# Patient Record
Sex: Male | Born: 1970 | Hispanic: Yes | Marital: Married | Smoking: Never smoker
Health system: Southern US, Community
[De-identification: ages and names within clinical notes are randomized; demographics above are authoritative.]

## PROBLEM LIST (undated history)

## (undated) DIAGNOSIS — E785 Hyperlipidemia, unspecified: Secondary | ICD-10-CM

## (undated) DIAGNOSIS — I10 Essential (primary) hypertension: Secondary | ICD-10-CM

## (undated) DIAGNOSIS — A6920 Lyme disease, unspecified: Secondary | ICD-10-CM

---

## 2018-08-26 ENCOUNTER — Encounter: Payer: Self-pay | Admitting: Emergency Medicine

## 2018-08-26 ENCOUNTER — Inpatient Hospital Stay
Admission: EM | Admit: 2018-08-26 | Discharge: 2018-08-27 | DRG: 918 | Disposition: A | Discharge status: Home / Self Care | Payer: BLUE CROSS/BLUE SHIELD | Attending: Internal Medicine | Admitting: Internal Medicine

## 2018-08-26 ENCOUNTER — Emergency Department: Payer: BLUE CROSS/BLUE SHIELD

## 2018-08-26 ENCOUNTER — Inpatient Hospital Stay: Payer: BLUE CROSS/BLUE SHIELD

## 2018-08-26 ENCOUNTER — Other Ambulatory Visit: Payer: Self-pay

## 2018-08-26 DIAGNOSIS — T405X1A Poisoning by cocaine, accidental (unintentional), initial encounter: Principal | ICD-10-CM | POA: Diagnosis present

## 2018-08-26 DIAGNOSIS — E785 Hyperlipidemia, unspecified: Secondary | ICD-10-CM | POA: Diagnosis present

## 2018-08-26 DIAGNOSIS — R51 Headache: Secondary | ICD-10-CM | POA: Diagnosis present

## 2018-08-26 DIAGNOSIS — F1010 Alcohol abuse, uncomplicated: Secondary | ICD-10-CM | POA: Diagnosis present

## 2018-08-26 DIAGNOSIS — Z1159 Encounter for screening for other viral diseases: Secondary | ICD-10-CM

## 2018-08-26 DIAGNOSIS — F1490 Cocaine use, unspecified, uncomplicated: Secondary | ICD-10-CM | POA: Diagnosis present

## 2018-08-26 DIAGNOSIS — Z79899 Other long term (current) drug therapy: Secondary | ICD-10-CM | POA: Diagnosis not present

## 2018-08-26 DIAGNOSIS — Z8249 Family history of ischemic heart disease and other diseases of the circulatory system: Secondary | ICD-10-CM

## 2018-08-26 DIAGNOSIS — E860 Dehydration: Secondary | ICD-10-CM | POA: Diagnosis present

## 2018-08-26 DIAGNOSIS — R7989 Other specified abnormal findings of blood chemistry: Secondary | ICD-10-CM

## 2018-08-26 DIAGNOSIS — I10 Essential (primary) hypertension: Secondary | ICD-10-CM | POA: Diagnosis present

## 2018-08-26 DIAGNOSIS — E872 Acidosis: Secondary | ICD-10-CM | POA: Diagnosis present

## 2018-08-26 DIAGNOSIS — R519 Headache, unspecified: Secondary | ICD-10-CM

## 2018-08-26 HISTORY — DX: Essential (primary) hypertension: I10

## 2018-08-26 HISTORY — DX: Lyme disease, unspecified: A69.20

## 2018-08-26 HISTORY — DX: Hyperlipidemia, unspecified: E78.5

## 2018-08-26 LAB — CBC WITH DIFFERENTIAL/PLATELET
Abs Immature Granulocytes: 0.02 10*3/uL (ref 0.00–0.07)
Basophils Absolute: 0 10*3/uL (ref 0.0–0.1)
Basophils Relative: 1 %
Eosinophils Absolute: 0 10*3/uL (ref 0.0–0.5)
Eosinophils Relative: 0 %
HCT: 44.4 % (ref 39.0–52.0)
Hemoglobin: 15.6 g/dL (ref 13.0–17.0)
Immature Granulocytes: 0 %
Lymphocytes Relative: 29 %
Lymphs Abs: 1.4 10*3/uL (ref 0.7–4.0)
MCH: 30.5 pg (ref 26.0–34.0)
MCHC: 35.1 g/dL (ref 30.0–36.0)
MCV: 86.9 fL (ref 80.0–100.0)
Monocytes Absolute: 0.3 10*3/uL (ref 0.1–1.0)
Monocytes Relative: 7 %
Neutro Abs: 3 10*3/uL (ref 1.7–7.7)
Neutrophils Relative %: 63 %
Platelets: 179 10*3/uL (ref 150–400)
RBC: 5.11 MIL/uL (ref 4.22–5.81)
RDW: 12.9 % (ref 11.5–15.5)
WBC: 4.9 10*3/uL (ref 4.0–10.5)
nRBC: 0 % (ref 0.0–0.2)

## 2018-08-26 LAB — BLOOD GAS, ARTERIAL
Acid-Base Excess: 2 mmol/L (ref 0.0–2.0)
Allens test (pass/fail): POSITIVE — AB
Bicarbonate: 27.8 mmol/L (ref 20.0–28.0)
FIO2: 0.21
O2 Saturation: 96.7 %
Patient temperature: 37
pCO2 arterial: 47 mmHg (ref 32.0–48.0)
pH, Arterial: 7.38 (ref 7.350–7.450)
pO2, Arterial: 89 mmHg (ref 83.0–108.0)

## 2018-08-26 LAB — COMPREHENSIVE METABOLIC PANEL
ALT: 29 U/L (ref 0–44)
AST: 32 U/L (ref 15–41)
Albumin: 4.5 g/dL (ref 3.5–5.0)
Alkaline Phosphatase: 55 U/L (ref 38–126)
Anion gap: 16 — ABNORMAL HIGH (ref 5–15)
BUN: 6 mg/dL (ref 6–20)
CO2: 24 mmol/L (ref 22–32)
Calcium: 8.5 mg/dL — ABNORMAL LOW (ref 8.9–10.3)
Chloride: 97 mmol/L — ABNORMAL LOW (ref 98–111)
Creatinine, Ser: 0.5 mg/dL — ABNORMAL LOW (ref 0.61–1.24)
GFR calc Af Amer: >60 mL/min (ref >60)
GFR calc non Af Amer: >60 mL/min (ref >60)
Glucose, Bld: 199 mg/dL — ABNORMAL HIGH (ref 70–99)
Potassium: 3.5 mmol/L (ref 3.5–5.1)
Sodium: 137 mmol/L (ref 135–145)
Total Bilirubin: 0.7 mg/dL (ref 0.3–1.2)
Total Protein: 7.9 g/dL (ref 6.5–8.1)

## 2018-08-26 LAB — LACTIC ACID, PLASMA
Lactic Acid, Venous: 3 mmol/L (ref 0.5–1.9)
Lactic Acid, Venous: 3.3 mmol/L (ref 0.5–1.9)
Lactic Acid, Venous: 3.4 mmol/L (ref 0.5–1.9)
Lactic Acid, Venous: 2.9 mmol/L (ref 0.5–1.9)

## 2018-08-26 LAB — URINALYSIS, ROUTINE W REFLEX MICROSCOPIC
Bacteria, UA: NONE SEEN
Bilirubin Urine: NEGATIVE
Glucose, UA: 50 mg/dL — AB
Ketones, ur: 5 mg/dL — AB
Leukocytes,Ua: NEGATIVE
Nitrite: NEGATIVE
Protein, ur: 30 mg/dL — AB
Specific Gravity, Urine: 1.012 (ref 1.005–1.030)
Squamous Epithelial / HPF: NONE SEEN (ref 0–5)
pH: 5 (ref 5.0–8.0)

## 2018-08-26 LAB — GLUCOSE, CAPILLARY: Glucose-Capillary: 186 mg/dL — ABNORMAL HIGH (ref 70–99)

## 2018-08-26 LAB — URINE DRUG SCREEN, QUALITATIVE (ARMC ONLY)
Amphetamines, Ur Screen: NOT DETECTED
Barbiturates, Ur Screen: NOT DETECTED
Benzodiazepine, Ur Scrn: NOT DETECTED
Cannabinoid 50 Ng, Ur ~~LOC~~: NOT DETECTED
Cocaine Metabolite,Ur ~~LOC~~: POSITIVE — AB
MDMA (Ecstasy)Ur Screen: NOT DETECTED
Methadone Scn, Ur: NOT DETECTED
Opiate, Ur Screen: NOT DETECTED
Phencyclidine (PCP) Ur S: NOT DETECTED
Tricyclic, Ur Screen: NOT DETECTED

## 2018-08-26 LAB — SARS CORONAVIRUS 2 BY RT PCR (HOSPITAL ORDER, PERFORMED IN ~~LOC~~ HOSPITAL LAB): SARS Coronavirus 2: NEGATIVE

## 2018-08-26 LAB — ETHANOL: Alcohol, Ethyl (B): 241 mg/dL — ABNORMAL HIGH (ref <10)

## 2018-08-26 LAB — BETA-HYDROXYBUTYRIC ACID: Beta-Hydroxybutyric Acid: 0.22 mmol/L (ref 0.05–0.27)

## 2018-08-26 LAB — MAGNESIUM: Magnesium: 2.1 mg/dL (ref 1.7–2.4)

## 2018-08-26 LAB — SALICYLATE LEVEL: Salicylate Lvl: <7 mg/dL (ref 2.8–30.0)

## 2018-08-26 LAB — ACETAMINOPHEN LEVEL: Acetaminophen (Tylenol), Serum: 12 ug/mL (ref 10–30)

## 2018-08-26 MED ORDER — LORAZEPAM 2 MG/ML IJ SOLN: 0.0000 mg | Freq: Two times a day (BID) | INTRAMUSCULAR | Status: DC

## 2018-08-26 MED ORDER — HYDRALAZINE HCL 20 MG/ML IJ SOLN
10.0000 mg | Freq: Four times a day (QID) | INTRAMUSCULAR | Status: DC | PRN
  Filled 2018-08-26: qty 0.5

## 2018-08-26 MED ORDER — SODIUM CHLORIDE 0.9 % IV BOLUS
1000.0000 mL | Freq: Once | INTRAVENOUS | Status: AC
  Administered 2018-08-26: 1000 mL via INTRAVENOUS

## 2018-08-26 MED ORDER — POLYETHYLENE GLYCOL 3350 17 G PO PACK: 17.0000 g | PACK | Freq: Every day | ORAL | Status: DC | PRN

## 2018-08-26 MED ORDER — LORAZEPAM 2 MG/ML IJ SOLN
1.0000 mg | Freq: Once | INTRAMUSCULAR | Status: AC
  Administered 2018-08-26: 1 mg via INTRAVENOUS
  Filled 2018-08-26: qty 1

## 2018-08-26 MED ORDER — LISINOPRIL 5 MG PO TABS
5.0000 mg | ORAL_TABLET | Freq: Every day | ORAL | Status: DC
  Administered 2018-08-26 – 2018-08-27 (×2): 5 mg via ORAL
  Filled 2018-08-26 (×2): qty 1

## 2018-08-26 MED ORDER — ACETAMINOPHEN 650 MG RE SUPP
650.0000 mg | Freq: Four times a day (QID) | RECTAL | Status: DC | PRN
  Filled 2018-08-26: qty 1

## 2018-08-26 MED ORDER — SUMATRIPTAN SUCCINATE 50 MG PO TABS
25.0000 mg | ORAL_TABLET | ORAL | Status: DC | PRN
  Filled 2018-08-26: qty 1

## 2018-08-26 MED ORDER — HYDROMORPHONE HCL 1 MG/ML IJ SOLN
1.0000 mg | INTRAMUSCULAR | Status: AC
  Administered 2018-08-26: 1 mg via INTRAVENOUS
  Filled 2018-08-26: qty 1

## 2018-08-26 MED ORDER — THIAMINE HCL 100 MG/ML IJ SOLN
Freq: Once | INTRAVENOUS | Status: DC
  Filled 2018-08-26: qty 1000

## 2018-08-26 MED ORDER — FOLIC ACID 1 MG PO TABS
1.0000 mg | ORAL_TABLET | Freq: Once | ORAL | Status: AC
  Administered 2018-08-26: 09:00:00 1 mg via ORAL
  Filled 2018-08-26: qty 1

## 2018-08-26 MED ORDER — THIAMINE HCL 100 MG/ML IJ SOLN
100.0000 mg | Freq: Once | INTRAMUSCULAR | Status: AC
  Administered 2018-08-26: 100 mg via INTRAVENOUS
  Filled 2018-08-26: qty 2

## 2018-08-26 MED ORDER — FOLIC ACID 1 MG PO TABS
1.0000 mg | ORAL_TABLET | Freq: Every day | ORAL | Status: DC
  Administered 2018-08-27: 1 mg via ORAL
  Filled 2018-08-26: qty 1

## 2018-08-26 MED ORDER — LORAZEPAM 2 MG/ML IJ SOLN
0.0000 mg | Freq: Four times a day (QID) | INTRAMUSCULAR | Status: DC
  Administered 2018-08-26 – 2018-08-27 (×2): 1 mg via INTRAVENOUS
  Filled 2018-08-26 (×3): qty 1

## 2018-08-26 MED ORDER — ENOXAPARIN SODIUM 40 MG/0.4ML ~~LOC~~ SOLN
40.0000 mg | SUBCUTANEOUS | Status: DC
  Filled 2018-08-26: qty 0.4

## 2018-08-26 MED ORDER — VITAMIN B-1 100 MG PO TABS: 100.0000 mg | ORAL_TABLET | Freq: Every day | ORAL | Status: DC

## 2018-08-26 MED ORDER — LORAZEPAM 2 MG/ML IJ SOLN
1.0000 mg | Freq: Four times a day (QID) | INTRAMUSCULAR | Status: DC | PRN
  Administered 2018-08-27 (×2): 1 mg via INTRAVENOUS
  Filled 2018-08-26 (×2): qty 1

## 2018-08-26 MED ORDER — TRAMADOL HCL 50 MG PO TABS
50.0000 mg | ORAL_TABLET | Freq: Four times a day (QID) | ORAL | Status: DC | PRN
  Administered 2018-08-26 – 2018-08-27 (×3): 50 mg via ORAL
  Filled 2018-08-26 (×3): qty 1

## 2018-08-26 MED ORDER — ADULT MULTIVITAMIN W/MINERALS CH
1.0000 | ORAL_TABLET | Freq: Every day | ORAL | Status: DC
  Administered 2018-08-27: 1 via ORAL
  Filled 2018-08-26: qty 1

## 2018-08-26 MED ORDER — ONDANSETRON HCL 4 MG PO TABS: 4.0000 mg | ORAL_TABLET | Freq: Four times a day (QID) | ORAL | Status: DC | PRN

## 2018-08-26 MED ORDER — SODIUM CHLORIDE 0.9 % IV BOLUS
500.0000 mL | Freq: Once | INTRAVENOUS | Status: AC
  Administered 2018-08-26: 500 mL via INTRAVENOUS

## 2018-08-26 MED ORDER — THIAMINE HCL 100 MG/ML IJ SOLN
500.0000 mg | Freq: Every day | INTRAVENOUS | Status: DC
  Administered 2018-08-26: 500 mg via INTRAVENOUS
  Filled 2018-08-26 (×2): qty 5

## 2018-08-26 MED ORDER — ONDANSETRON HCL 4 MG/2ML IJ SOLN
4.0000 mg | INTRAMUSCULAR | Status: AC
  Administered 2018-08-26: 06:00:00 4 mg via INTRAVENOUS
  Filled 2018-08-26: qty 2

## 2018-08-26 MED ORDER — LORAZEPAM 2 MG PO TABS: 0.0000 mg | ORAL_TABLET | Freq: Two times a day (BID) | ORAL | Status: DC

## 2018-08-26 MED ORDER — ACETAMINOPHEN 325 MG PO TABS: 650.0000 mg | ORAL_TABLET | Freq: Four times a day (QID) | ORAL | Status: DC | PRN

## 2018-08-26 MED ORDER — LORAZEPAM 2 MG PO TABS: 0.0000 mg | ORAL_TABLET | Freq: Four times a day (QID) | ORAL | Status: DC

## 2018-08-26 MED ORDER — DEXAMETHASONE SODIUM PHOSPHATE 10 MG/ML IJ SOLN
10.0000 mg | Freq: Once | INTRAMUSCULAR | Status: AC
  Administered 2018-08-26: 10 mg via INTRAVENOUS
  Filled 2018-08-26: qty 1

## 2018-08-26 MED ORDER — LORAZEPAM 1 MG PO TABS: 1.0000 mg | ORAL_TABLET | Freq: Four times a day (QID) | ORAL | Status: DC | PRN

## 2018-08-26 MED ORDER — THIAMINE HCL 100 MG/ML IJ SOLN: 100.0000 mg | Freq: Every day | INTRAMUSCULAR | Status: DC

## 2018-08-26 MED ORDER — LORAZEPAM 2 MG/ML IJ SOLN
0.0000 mg | Freq: Four times a day (QID) | INTRAMUSCULAR | Status: DC
  Administered 2018-08-27: 2 mg via INTRAVENOUS

## 2018-08-26 MED ORDER — ONDANSETRON HCL 4 MG/2ML IJ SOLN: 4.0000 mg | Freq: Four times a day (QID) | INTRAMUSCULAR | Status: DC | PRN

## 2018-08-26 NOTE — ED Triage Notes (Addendum)
Pt arrived via EMS from home with reports of headache x 3 that is worsening. Per family pt has also been drinking excessive amounts of liquor over the past 3 days. Pt did drink tonight.  Per EMS, several bottles of BP meds found that were filled in Feb and March of this year that were full that the pt had not taken. Pt denies any photosensitivity, nausea or vomiting.  Pt is alert and oriented on arrival, but freuqently moaning and holding head.

## 2018-08-26 NOTE — Consult Note (Signed)
Reason for Consult: headaches  Referring Physician: Dr. Benjie Karvonen  CC: headaches   HPI: Christopher Ayala is an 48 y.o. male with a known history of daily EtOH abuse and essential hypertension who takes medications on a as needed basis who presents to the emergency room due to headache.  Patient reports that he has had chronic headaches since age 60 however over the past few days his headache has not improved with Tylenol.  He denies photophobia, neck stiffness or fevers. Headaches described as frontal and do not radiate. Last ETOH use 3 AM today    Past Medical History:  Diagnosis Date  . Hyperlipidemia   . Hypertension   . Lyme disease     History reviewed. No pertinent surgical history.  No family history on file.  Social History:  reports that he has never smoked. He has never used smokeless tobacco. He reports current alcohol use. No history on file for drug.  No Known Allergies  Medications: I have reviewed the patient's current medications.  ROS: History obtained from the patient  General ROS: negative for - chills, fatigue, fever, night sweats, weight gain or weight loss Psychological ROS: negative for - behavioral disorder, hallucinations, memory difficulties, mood swings or suicidal ideation Ophthalmic ROS: negative for - blurry vision, double vision, eye pain or loss of vision ENT ROS: negative for - epistaxis, nasal discharge, oral lesions, sore throat, tinnitus or vertigo Allergy and Immunology ROS: negative for - hives or itchy/watery eyes Hematological and Lymphatic ROS: negative for - bleeding problems, bruising or swollen lymph nodes Endocrine ROS: negative for - galactorrhea, hair pattern changes, polydipsia/polyuria or temperature intolerance Respiratory ROS: negative for - cough, hemoptysis, shortness of breath or wheezing Cardiovascular ROS: negative for - chest pain, dyspnea on exertion, edema or irregular heartbeat Gastrointestinal ROS: negative for -  abdominal pain, diarrhea, hematemesis, nausea/vomiting or stool incontinence Genito-Urinary ROS: negative for - dysuria, hematuria, incontinence or urinary frequency/urgency Musculoskeletal ROS: negative for - joint swelling or muscular weakness Neurological ROS: as noted in HPI Dermatological ROS: negative for rash and skin lesion changes  Physical Examination: Blood pressure (!) 159/88, pulse (!) 110, temperature 97.8 F (36.6 C), temperature source Oral, resp. rate 20, height 5\' 6"  (1.676 m), weight 81.6 kg, SpO2 100 %.   Neurological Examination   Mental Status: Alert, oriented, thought content appropriate.  Speech fluent without evidence of aphasia.  Able to follow 3 step commands without difficulty. Cranial Nerves: II: Discs flat bilaterally; Visual fields grossly normal, pupils equal, round, reactive to light and accommodation III,IV, VI: ptosis not present, extra-ocular motions intact bilaterally V,VII: smile symmetric, facial light touch sensation normal bilaterally VIII: hearing normal bilaterally IX,X: gag reflex present XI: bilateral shoulder shrug XII: midline tongue extension Motor: Right : Upper extremity   5/5    Left:     Upper extremity   5/5  Lower extremity   5/5     Lower extremity   5/5 Tone and bulk:normal tone throughout; no atrophy noted Sensory: Pinprick and light touch intact throughout, bilaterally Deep Tendon Reflexes: 2+ and symmetric throughout Plantars: Right: downgoing   Left: downgoing Cerebellar: Essential tremor bilaterally  Gait: not tested      Laboratory Studies:   Basic Metabolic Panel: Recent Labs  Lab 08/26/18 0546  NA 137  K 3.5  CL 97*  CO2 24  GLUCOSE 199*  BUN 6  CREATININE 0.50*  CALCIUM 8.5*  MG 2.1    Liver Function Tests: Recent Labs  Lab 08/26/18  0546  AST 32  ALT 29  ALKPHOS 55  BILITOT 0.7  PROT 7.9  ALBUMIN 4.5   No results for input(s): LIPASE, AMYLASE in the last 168 hours. No results for  input(s): AMMONIA in the last 168 hours.  CBC: Recent Labs  Lab 08/26/18 0546  WBC 4.9  NEUTROABS 3.0  HGB 15.6  HCT 44.4  MCV 86.9  PLT 179    Cardiac Enzymes: No results for input(s): CKTOTAL, CKMB, CKMBINDEX, TROPONINI in the last 168 hours.  BNP: Invalid input(s): POCBNP  CBG: Recent Labs  Lab 08/26/18 0544  GLUCAP 186*    Microbiology: Results for orders placed or performed during the hospital encounter of 08/26/18  SARS Coronavirus 2 (CEPHEID - Performed in [facility]), Hosp Order     Status: None   Collection Time: 08/26/18 10:13 AM   Specimen: Nasopharyngeal Swab  Result Value Ref Range Status   SARS Coronavirus 2 NEGATIVE NEGATIVE Final    Comment: (NOTE) If result is NEGATIVE SARS-CoV-2 target nucleic acids are NOT DETECTED. The SARS-CoV-2 RNA is generally detectable in upper and lower  respiratory specimens during the acute phase of infection. The lowest  concentration of SARS-CoV-2 viral copies this assay can detect is 250  copies / mL. A negative result does not preclude SARS-CoV-2 infection  and should not be used as the sole basis for treatment or other  patient management decisions.  A negative result may occur with  improper specimen collection / handling, submission of specimen other  than nasopharyngeal swab, presence of viral mutation(s) within the  areas targeted by this assay, and inadequate number of viral copies  (<250 copies / mL). A negative result must be combined with clinical  observations, patient history, and epidemiological information. If result is POSITIVE SARS-CoV-2 target nucleic acids are DETECTED. The SARS-CoV-2 RNA is generally detectable in upper and lower  respiratory specimens dur ing the acute phase of infection.  Positive  results are indicative of active infection with SARS-CoV-2.  Clinical  correlation with patient history and other diagnostic information is  necessary to determine patient infection  status.  Positive results do  not rule out bacterial infection or co-infection with other viruses. If result is PRESUMPTIVE POSTIVE SARS-CoV-2 nucleic acids MAY BE PRESENT.   A presumptive positive result was obtained on the submitted specimen  and confirmed on repeat testing.  While 2019 novel coronavirus  (SARS-CoV-2) nucleic acids may be present in the submitted sample  additional confirmatory testing may be necessary for epidemiological  and / or clinical management purposes  to differentiate between  SARS-CoV-2 and other Sarbecovirus currently known to infect humans.  If clinically indicated additional testing with an alternate test  methodology [phone]([id number]) is advised. The SARS-CoV-2 RNA is generally  detectable in upper and lower respiratory sp ecimens during the acute  phase of infection. The expected result is Negative. Fact Sheet for Patients:  BoilerBrush.com.cyhttps://www.fda.gov/media/136312/download Fact Sheet for Healthcare Providers: https://pope.com/https://www.fda.gov/media/136313/download This test is not yet approved or cleared by the [place] FDA and has been authorized for detection and/or diagnosis of SARS-CoV-2 by FDA under an Emergency Use Authorization (EUA).  This EUA will remain in effect (meaning this test can be used) for the duration of the COVID-19 declaration under Section 564(b)(1) of the Act, 21 U.S.C. section 360bbb-3(b)(1), unless the authorization is terminated or revoked sooner. Performed at [facility], [address] [place], [place] [address]     Coagulation Studies: No results for input(s): LABPROT, INR  in the last 72 hours.  Urinalysis:  Recent Labs  Lab 08/26/18 0533  COLORURINE YELLOW*  LABSPEC 1.012  PHURINE 5.0  GLUCOSEU 50*  HGBUR SMALL*  BILIRUBINUR NEGATIVE  KETONESUR 5*  PROTEINUR 30*  NITRITE NEGATIVE  LEUKOCYTESUR NEGATIVE    Lipid Panel:  No results found for: CHOL, TRIG, HDL, CHOLHDL, VLDL, LDLCALC  HgbA1C: No results found for:  HGBA1C  Urine Drug Screen:      Component Value Date/Time   LABOPIA NONE DETECTED 08/26/2018 0533   COCAINSCRNUR POSITIVE (A) 08/26/2018 0533   LABBENZ NONE DETECTED 08/26/2018 0533   AMPHETMU NONE DETECTED 08/26/2018 0533   THCU NONE DETECTED 08/26/2018 0533   LABBARB NONE DETECTED 08/26/2018 0533    Alcohol Level:  Recent Labs  Lab 08/26/18 0546  ETH 241*    Other results: EKG: normal EKG, normal sinus rhythm, unchanged from previous tracings.  Imaging: Ct Head Wo Contrast  Result Date: 08/26/2018 CLINICAL DATA:  Headache, acute EXAM: CT HEAD WITHOUT CONTRAST TECHNIQUE: Contiguous axial images were obtained from the base of the skull through the vertex without intravenous contrast. COMPARISON:  None. FINDINGS: Brain: No evidence of acute infarction, hemorrhage, hydrocephalus, extra-axial collection or mass lesion/mass effect. Vascular: No hyperdense vessel or unexpected calcification. Skull: Normal. Negative for fracture or focal lesion. Sinuses/Orbits: No acute finding. IMPRESSION: Negative head CT. Electronically Signed   By: Marnee SpringJonathon  Watts M.D.   On: 08/26/2018 06:23     Assessment/Plan:  48 y.o. male with a known history of daily EtOH abuse and essential hypertension who takes medications on a as needed basis who presents to the emergency room due to headache.  Patient reports that he has had chronic headaches since age 48 however over the past few days his headache has not improved with Tylenol.  He denies photophobia, neck stiffness or fevers. Headaches described as frontal and do not radiate. Last ETOH use 3 AM today   - CTH no acute abnormalities - Increase in headaches agree with MRI brain - Thiamine increased to 500mg  IV x 3 days - Follic acid - Decadron 10mg  IV x 1 ordered  08/26/2018, 2:29 PM

## 2018-08-26 NOTE — ED Notes (Signed)
Returned from CT.

## 2018-08-26 NOTE — ED Notes (Signed)
Pt transported to MRI 

## 2018-08-26 NOTE — ED Provider Notes (Signed)
[facility] Emergency Department Provider Note  ____________________________________________   First MD Initiated Contact with Patient 08/26/18 [phone]     (approximate)  I have reviewed the triage vital signs and the nursing notes.   HISTORY  Chief Complaint Headache   The patient and/or family speak(s) Spanish.  They understand they have the right to the use of a hospital interpreter, however at this time they prefer to speak directly with me in [place].  They know that they can ask for an interpreter at any time.   HPI Christopher Ayala is a 48 y.o. male with medical history as listed below who presents by EMS for evaluation of severe headache.  He reports that it is been going on for at least 4 days and maybe 5.  He cannot remember if it started all of a sudden or if it started gradually.  It has been constant for the last for 5 days although it waxes and wanes in intensity.  It has been steadily getting worse and it is currently severe.  He said that he frequently drinks alcohol but he has been drinking much more over the last few days to try to help him sleep because the headache is so bad he cannot sleep.  He reports that it is his whole head.  He has no neck pain and no neck stiffness.  He denies any contact with COVID-19 patients.  He denies fever/chills, sore throat, cough, chest pain, shortness of breath, nausea, vomiting, body aches, abdominal pain, dysuria, diarrhea, and constipation.   He has never had a headache like this before.  He denies trauma or any injury of any kind.  He has no visual complaints.  He reports that he has occasionally taken his blood pressure medicine over the last couple of months but he does not take it regularly.  He said that his thirst has been very severe over the last few days and he is currently crying from the headache and begging for drink.  He has no history of diabetes.        Past Medical History:  Diagnosis  Date  . Hyperlipidemia   . Hypertension   . Lyme disease     There are no active problems to display for this patient.   History reviewed. No pertinent surgical history.  Prior to Admission medications   Medication Sig Start Date End Date Taking? Authorizing Provider  lisinopril-hydrochlorothiazide (ZESTORETIC) 10-12.5 MG tablet Take 1 tablet by mouth daily.   Yes [provider]    Allergies Patient has no known allergies.  No family history on file.  Social History Social History   Tobacco Use  . Smoking status: Never Smoker  . Smokeless tobacco: Never Used  Substance Use Topics  . Alcohol use: Yes  . Drug use: Not on file    Review of Systems Constitutional: No fever/chills Eyes: No visual changes. ENT: No sore throat. Cardiovascular: Denies chest pain. Respiratory: Denies shortness of breath. Gastrointestinal: No abdominal pain.  No nausea, no vomiting.  No diarrhea.  No constipation. Genitourinary: Negative for dysuria. Musculoskeletal: Negative for neck pain.  Negative for back pain. Integumentary: Negative for rash. Neurological: Severe generalized headache as described above.  No focal weakness or numbness.   ____________________________________________   PHYSICAL EXAM:  VITAL SIGNS: ED Triage Vitals  Enc Vitals Group     BP 08/26/18 0525 (!) 163/95     Pulse Rate 08/26/18 0525 92     Resp 08/26/18 0525  16     Temp 08/26/18 0525 (!) 97.5 F (36.4 C)     Temp Source 08/26/18 0525 Oral     SpO2 08/26/18 0525 96 %     Weight 08/26/18 0526 81.6 kg (180 lb)     Height 08/26/18 0526 1.676 m (5\' 6" )     Head Circumference --      Peak Flow --      Pain Score 08/26/18 0530 10     Pain Loc --      Pain Edu? --      Excl. in GC? --     Constitutional: Alert and oriented.  Generally well-appearing but does appear to be in severe pain. Eyes: Conjunctivae are normal.  Pupils are relatively small bilaterally and minimally reactive to light but  are equal. Head: Atraumatic. Nose: No congestion/rhinnorhea. Mouth/Throat: Mucous membranes are moist. Neck: No stridor.  No meningeal signs.   Cardiovascular: Normal rate, regular rhythm. Good peripheral circulation. Grossly normal heart sounds. Respiratory: Normal respiratory effort.  No retractions. No audible wheezing. Gastrointestinal: Soft and nontender. No distention.  Musculoskeletal: No lower extremity tenderness nor edema. No gross deformities of extremities. Neurologic:  Normal speech and language. No gross focal neurologic deficits are appreciated.  Skin:  Skin is warm, dry and intact. No rash noted. Psychiatric: The patient is in pain and is tearful during the history and physical exam, otherwise unremarkable psychiatric exam.  Admits to a large amount of drinking.  ____________________________________________   LABS (all labs ordered are listed, but only abnormal results are displayed)  Labs Reviewed  COMPREHENSIVE METABOLIC PANEL - Abnormal; Notable for the following components:      Result Value   Chloride 97 (*)    Glucose, Bld 199 (*)    Creatinine, Ser 0.50 (*)    Calcium 8.5 (*)    Anion gap 16 (*)    All other components within normal limits  ETHANOL - Abnormal; Notable for the following components:   Alcohol, Ethyl (B) 241 (*)    All other components within normal limits  LACTIC ACID, PLASMA - Abnormal; Notable for the following components:   Lactic Acid, Venous 3.0 (*)    All other components within normal limits  GLUCOSE, CAPILLARY - Abnormal; Notable for the following components:   Glucose-Capillary 186 (*)    All other components within normal limits  CBC WITH DIFFERENTIAL/PLATELET  MAGNESIUM  ACETAMINOPHEN LEVEL  SALICYLATE LEVEL  BETA-HYDROXYBUTYRIC ACID  URINALYSIS, ROUTINE W REFLEX MICROSCOPIC  URINE DRUG SCREEN, QUALITATIVE ([facility] ONLY)  LACTIC ACID, PLASMA  CBG MONITORING, ED   ____________________________________________  EKG  None -  EKG not ordered by ED physician ____________________________________________  RADIOLOGY Marylou MccoyI, Takela Varden, personally discussed these images and results by phone with the on-call radiologist and used this discussion as part of my medical decision making.    ED MD interpretation: No acute abnormalities identified on head CT without contrast.  MRI of the brain and MRAs head/neck pending at time of signout.  Official radiology report(s): Ct Head Wo Contrast  Result Date: 08/26/2018 CLINICAL DATA:  Headache, acute EXAM: CT HEAD WITHOUT CONTRAST TECHNIQUE: Contiguous axial images were obtained from the base of the skull through the vertex without intravenous contrast. COMPARISON:  None. FINDINGS: Brain: No evidence of acute infarction, hemorrhage, hydrocephalus, extra-axial collection or mass lesion/mass effect. Vascular: No hyperdense vessel or unexpected calcification. Skull: Normal. Negative for fracture or focal lesion. Sinuses/Orbits: No acute finding. IMPRESSION: Negative head CT. Electronically Signed   By:  Marnee SpringJonathon  Watts M.D.   On: 08/26/2018 06:23    ____________________________________________   PROCEDURES   Procedure(s) performed (including Critical Care):  Procedures   ____________________________________________   INITIAL IMPRESSION / MDM / ASSESSMENT AND PLAN / ED COURSE  As part of my medical decision making, I reviewed the following data within the electronic MEDICAL RECORD NUMBER Nursing notes reviewed and incorporated, Labs reviewed , Old chart reviewed, Patient signed out to Dr. Alphonzo LemmingsMcShane, Discussed with radiologist and Notes from prior ED visits      *Margurite Auerbachlfredo Melos Tamera PuntMiranda was evaluated in Emergency Department on 08/26/2018 for the symptoms described in the history of present illness. He was evaluated in the context of the global COVID-19 pandemic, which necessitated consideration that the patient might be at risk for infection with the SARS-CoV-2 virus that causes COVID-19.  Institutional protocols and algorithms that pertain to the evaluation of patients at risk for COVID-19 are in a state of rapid change based on information released by regulatory bodies including the CDC and federal and state organizations. These policies and algorithms were followed during the patient's care in the ED.  Some ED evaluations and interventions may be delayed as a result of limited staffing during the pandemic.*  Differential diagnosis includes, but is not limited to, acute intracranial hemorrhage, brain tumor, vertebral artery dissection, PRES syndrome, alcoholic ketoacidosis, diabetes (DKA versus HHNS), less likely meningitis or encephalitis.  The patient adamantly denies any infectious symptoms recently and says that it is only his head and the first that is bothering him.  He has no focal neurological deficits and his vital signs are stable.  He is crying due to the amount of pain in his head as well as, I think, his frustration over it being constant for the last 4+ days.  Given the amount of discomfort he is experiencing and his level of upset, I am providing Dilaudid 1 mg IV and Zofran 4 mg IV.  I have asked him to remain n.p.o. and I ordered a CT scan without contrast for an initial assessment of any obvious acute intracranial abnormality.  I am a little bit concerned of the possibility that if the patient had a bleed 4 to 5 days ago it may not show up on noncontrast study but he will likely need additional evaluation regardless given the amount of pain he is experiencing.  He was able to fully flex and extend his neck and turn side to side and has absolutely no meningismus so I think spinal/cerebral infection is quite unlikely.  I am also giving thiamine 100 mg IV given the report of his heavy alcohol consumption.  Lab work is pending including tox screen, acetaminophen level, salicylate level, ethanol level, etc.  Fingerstick blood sugar during my history and physical was 186.  Clinical  Course as of Aug 26 715  Wed Aug 26, 2018  0620 The patient is noted to have a lactate=3. With the current information available to me, I don't think the patient is in septic shock. The lactate=3, is related to alcohol use and metabolic/electrolyte derangement.   Lactic Acid, Venous(!!): 3.0 [CF]  0621 WBC: 4.9 [CF]  0621 Acetaminophen (Tylenol), S: 12 [CF]  0621 Salicylate Lvl: <7.0 [CF]  0622 Slightly elevated anion gap of 16, glucose of 199, otherwise unremarkable.  Comprehensive metabolic panel(!) [CF]  [id number] Head CT is normal.  I called and discussed the case by phone with Dr. Grace IsaacWatts with radiology.  I expressed my concern over the possibility of PRES syndrome  versus a subacute subarachnoid.  He recommended MR brain without contrast for further evaluations of these possibilities as well as reevaluation of the patient to brain in general.  I do not see a reason at this time for  CT Head Wo Contrast [CF]  0642 Upon further consideration, given that I am also concerned about the possibility of aneurysmal bleed and vertebral artery dissection, and I have ordered MR angio head and MR angio neck.   [CF]  [phone] Transferring care to Dr. Alphonzo LemmingsMcShane at 7:00am to follow up beta hydroxybutyric acid result, urinalysis, urine drug screen, and MRIs.  Disposition will depend on the patient's symptoms and the results of the imaging.   [CF]    Clinical Course User Index [CF] Loleta RoseForbach, Ameya Vowell, MD     ____________________________________________  FINAL CLINICAL IMPRESSION(S) / ED DIAGNOSES  Final diagnoses:  Severe headache  Elevated lactic acid level  Alcohol abuse     MEDICATIONS GIVEN DURING THIS VISIT:  Medications  HYDROmorphone (DILAUDID) injection 1 mg (1 mg Intravenous Given 08/26/18 0610)  ondansetron (ZOFRAN) injection 4 mg (4 mg Intravenous Given 08/26/18 0610)  thiamine (B-1) injection 100 mg (100 mg Intravenous Given 08/26/18 0613)  sodium chloride 0.9 % bolus 1,000 mL (1,000 mLs  Intravenous New Bag/Given 08/26/18 [id number])     ED Discharge Orders    None       Note:  This document was prepared using Dragon voice recognition software and may include unintentional dictation errors.   Loleta RoseForbach, Mayreli Alden, MD 08/26/18 [phone]

## 2018-08-26 NOTE — ED Notes (Signed)
Pt remains sleep. Does wake to loud voice. Unlabored.

## 2018-08-26 NOTE — ED Notes (Signed)
Report received. Pt in MRI. Will assess pt when returns.

## 2018-08-26 NOTE — ED Notes (Signed)
Dr Burlene Arnt aware of lactic acid. Pt nauseated. Notified of withdrawal sx.

## 2018-08-26 NOTE — ED Provider Notes (Addendum)
-----------------------------------------   7:50 AM on 08/26/2018 -----------------------------------------  Patient with a EtOH of 241 presents today complaining of headache.  CT scan is negative neurologically intact.  Now these little bit more sober he does state that he has had a headache every day for many years and it waxes and wanes.  Sometimes is worse when he drinks more.  No trauma.  He is in no acute distress.  Prior physician did order an MRI for the patient admitted the chronicity of his headache, however, patient refuses.  He is only requesting something to eat and drink.  We will see how he feels after that.  ----------------------------------------- 10:03 AM on 08/26/2018 -----------------------------------------  With persistent lactic acidosis but no evidence of sepsis otherwise, he has a reassuring ABG with a normal pH, I suspect most of this is because of drugs and alcohol possibly some mild alcoholic ketoacidosis, however, as he is not clearing despite [place] liters of fluid in the emergency department we will admit him to the hospital for further evaluation.  I did talk to the hospitalist service they agree with management and admission.   Schuyler Amor, MD 08/26/18 5520    Schuyler Amor, MD 08/26/18 1004

## 2018-08-26 NOTE — ED Notes (Signed)
Called dietary for sandwich tray.

## 2018-08-26 NOTE — H&P (Signed)
[facility] NAME: Socorro Ebron    MR#:  [id number]  DATE OF BIRTH:  1970/08/12  DATE OF ADMISSION:  08/26/2018  PRIMARY CARE PHYSICIAN: Patient, No Pcp Per   REQUESTING/REFERRING PHYSICIAN:  Dr Burlene Arnt  CHIEF COMPLAINT:   headache HISTORY OF PRESENT ILLNESS:  Brylin Stanislawski  is a 48 y.o. male with a known history of EtOH abuse and essential hypertension who takes medications on a as needed basis who presents to the emergency room due to headache.  Patient reports that he has had migraine headaches since age 34 however over the past few days his headache has not improved with Tylenol.  He denies photophobia, neck stiffness or fevers.  His headache has actually improved with tramadol.    PAST MEDICAL HISTORY:   Past Medical History:  Diagnosis Date  . Hyperlipidemia   . Hypertension   . Lyme disease     PAST SURGICAL HISTORY:  none  SOCIAL HISTORY:   Social History   Tobacco Use  . Smoking status: Never Smoker  . Smokeless tobacco: Never Used  Substance Use Topics  . Alcohol use: Yes   Drink atleast 6-8 beers/day FAMILY HISTORY:  HTN  DRUG ALLERGIES:  No Known Allergies  REVIEW OF SYSTEMS:   Review of Systems  Constitutional: Positive for malaise/fatigue. Negative for chills and fever.  HENT: Negative.  Negative for ear discharge, ear pain, hearing loss, nosebleeds and sore throat.   Eyes: Negative.  Negative for blurred vision and pain.  Respiratory: Negative.  Negative for cough, hemoptysis, shortness of breath and wheezing.   Cardiovascular: Negative.  Negative for chest pain, palpitations and leg swelling.  Gastrointestinal: Negative.  Negative for abdominal pain, blood in stool, diarrhea, nausea and vomiting.  Genitourinary: Negative.  Negative for dysuria.  Musculoskeletal: Negative.  Negative for back pain.  Skin: Negative.   Neurological: Positive for headaches. Negative for  dizziness, tremors, speech change, focal weakness and seizures.  Endo/Heme/Allergies: Negative.  Does not bruise/bleed easily.  Psychiatric/Behavioral: Negative.  Negative for depression, hallucinations and suicidal ideas.    MEDICATIONS AT HOME:   Prior to Admission medications   Medication Sig Start Date End Date Taking? Authorizing Provider  lisinopril-hydrochlorothiazide (ZESTORETIC) 10-12.5 MG tablet Take 1 tablet by mouth daily.   Yes [provider]      VITAL SIGNS:  Blood pressure (!) 158/91, pulse 93, temperature 97.8 F (36.6 C), temperature source Oral, resp. rate 16, height 5\' 6"  (1.676 m), weight 81.6 kg, SpO2 96 %.  PHYSICAL EXAMINATION:   Physical Exam Constitutional:      General: He is not in acute distress. HENT:     Head: Normocephalic.  Eyes:     General: No scleral icterus. Neck:     Musculoskeletal: Normal range of motion and neck supple.     Vascular: No JVD.     Trachea: No tracheal deviation.  Cardiovascular:     Rate and Rhythm: Normal rate and regular rhythm.     Heart sounds: Normal heart sounds. No murmur. No friction rub. No gallop.   Pulmonary:     Effort: Pulmonary effort is normal. No respiratory distress.     Breath sounds: Normal breath sounds. No wheezing or rales.  Chest:     Chest wall: No tenderness.  Abdominal:     General: Bowel sounds are normal. There is no distension.     Palpations: Abdomen is soft. There is no mass.  Tenderness: There is no abdominal tenderness. There is no guarding or rebound.  Musculoskeletal: Normal range of motion.  Skin:    General: Skin is warm.     Findings: No erythema or rash.  Neurological:     Mental Status: He is alert and oriented to person, place, and time.  Psychiatric:        Judgment: Judgment normal.       LABORATORY PANEL:   CBC Recent Labs  Lab 08/26/18 0546  WBC 4.9  HGB 15.6  HCT 44.4  PLT 179    ------------------------------------------------------------------------------------------------------------------  Chemistries  Recent Labs  Lab 08/26/18 0546  NA 137  K 3.5  CL 97*  CO2 24  GLUCOSE 199*  BUN 6  CREATININE 0.50*  CALCIUM 8.5*  MG 2.1  AST 32  ALT 29  ALKPHOS 55  BILITOT 0.7   ------------------------------------------------------------------------------------------------------------------  Cardiac Enzymes No results for input(s): TROPONINI in the last 168 hours. ------------------------------------------------------------------------------------------------------------------  RADIOLOGY:  Ct Head Wo Contrast  Result Date: 08/26/2018 CLINICAL DATA:  Headache, acute EXAM: CT HEAD WITHOUT CONTRAST TECHNIQUE: Contiguous axial images were obtained from the base of the skull through the vertex without intravenous contrast. COMPARISON:  None. FINDINGS: Brain: No evidence of acute infarction, hemorrhage, hydrocephalus, extra-axial collection or mass lesion/mass effect. Vascular: No hyperdense vessel or unexpected calcification. Skull: Normal. Negative for fracture or focal lesion. Sinuses/Orbits: No acute finding. IMPRESSION: Negative head CT. Electronically Signed   By: Marnee SpringJonathon  Watts M.D.   On: 08/26/2018 06:23    EKG:  No orders found for this or any previous visit.  IMPRESSION AND PLAN:   48 year old male with history of EtOH abuse who presented with headache and found to have persistent lactic acidosis.  1.  Headache: It appears patient has history of headache.  He does not report this as being the worst headache of his life. Urine toxicology also noted to be positive for cocaine which may be contributing to headache. CT head negative MRI brain ordered given long history of headaches. Neurology consultation placed via epic. PRN pain medications and Imitrex  2.  Lactic acidosis likely from severe dehydration with EtOH abuse No signs of acute  infection IV fluids and follow lactic acid level  3.  EtOH abuse: CIWA protocol initiated Patient counseled on cutting down/quitting EtOH  4.  Essential hypertension: Patient reports taking his medications on a as needed basis. We will order lisinopril and PRN hydralazine and follow blood pressure      All the records are reviewed and case discussed with ED provider. Management plans discussed with the patient and he is in agreement  CODE STATUS: full  TOTAL TIME TAKING CARE OF THIS PATIENT: 45 minutes.    Adrian SaranSital Corine Solorio M.D on 08/26/2018 at 12:44 PM  Between 7am to 6pm - Pager - [phone]  After 6pm go to [email or web address] - password [email or web address]  [facility]  Office  [phone]  CC: Primary care physician; Patient, No Pcp Per

## 2018-08-26 NOTE — ED Notes (Signed)
Lactic acid 3.3 

## 2018-08-26 NOTE — ED Notes (Signed)
ED Provider at bedside. 

## 2018-08-27 DIAGNOSIS — R51 Headache: Secondary | ICD-10-CM | POA: Diagnosis not present

## 2018-08-27 LAB — BLOOD CULTURE ID PANEL (REFLEXED)

## 2018-08-27 LAB — BASIC METABOLIC PANEL
Anion gap: 11 (ref 5–15)
BUN: 9 mg/dL (ref 6–20)
CO2: 26 mmol/L (ref 22–32)
Calcium: 9.1 mg/dL (ref 8.9–10.3)
Chloride: 95 mmol/L — ABNORMAL LOW (ref 98–111)
Creatinine, Ser: 0.55 mg/dL — ABNORMAL LOW (ref 0.61–1.24)
GFR calc Af Amer: >60 mL/min (ref >60)
GFR calc non Af Amer: >60 mL/min (ref >60)
Glucose, Bld: 217 mg/dL — ABNORMAL HIGH (ref 70–99)
Potassium: 3.8 mmol/L (ref 3.5–5.1)
Sodium: 132 mmol/L — ABNORMAL LOW (ref 135–145)

## 2018-08-27 MED ORDER — FOLIC ACID 1 MG PO TABS: 1.0000 mg | ORAL_TABLET | Freq: Every day | ORAL | 0 refills | Status: AC

## 2018-08-27 MED ORDER — VITAMIN B-1 100 MG PO TABS: 100.0000 mg | ORAL_TABLET | Freq: Every day | ORAL | 0 refills | Status: AC

## 2018-08-27 NOTE — Consult Note (Signed)
Subjective: No further headaches.     Past Medical History:  Diagnosis Date  . Hyperlipidemia   . Hypertension   . Lyme disease     History reviewed. No pertinent surgical history.  History reviewed. No pertinent family history.  Social History:  reports that he has never smoked. He has never used smokeless tobacco. He reports current alcohol use. No history on file for drug.  No Known Allergies  Medications: I have reviewed the patient's current medications.  ROS: History obtained from the patient  General ROS: negative for - chills, fatigue, fever, night sweats, weight gain or weight loss Psychological ROS: negative for - behavioral disorder, hallucinations, memory difficulties, mood swings or suicidal ideation Ophthalmic ROS: negative for - blurry vision, double vision, eye pain or loss of vision ENT ROS: negative for - epistaxis, nasal discharge, oral lesions, sore throat, tinnitus or vertigo Allergy and Immunology ROS: negative for - hives or itchy/watery eyes Hematological and Lymphatic ROS: negative for - bleeding problems, bruising or swollen lymph nodes Endocrine ROS: negative for - galactorrhea, hair pattern changes, polydipsia/polyuria or temperature intolerance Respiratory ROS: negative for - cough, hemoptysis, shortness of breath or wheezing Cardiovascular ROS: negative for - chest pain, dyspnea on exertion, edema or irregular heartbeat Gastrointestinal ROS: negative for - abdominal pain, diarrhea, hematemesis, nausea/vomiting or stool incontinence Genito-Urinary ROS: negative for - dysuria, hematuria, incontinence or urinary frequency/urgency Musculoskeletal ROS: negative for - joint swelling or muscular weakness Neurological ROS: as noted in HPI Dermatological ROS: negative for rash and skin lesion changes  Physical Examination: Blood pressure (!) 142/97, pulse 95, temperature 98 F (36.7 C), temperature source Oral, resp. rate 14, height 5\' 6"  (1.676 m),  weight 81.6 kg, SpO2 95 %.   Neurological Examination   Mental Status: Alert, oriented, thought content appropriate.  Speech fluent without evidence of aphasia.  Able to follow 3 step commands without difficulty. Cranial Nerves: II: Discs flat bilaterally; Visual fields grossly normal, pupils equal, round, reactive to light and accommodation III,IV, VI: ptosis not present, extra-ocular motions intact bilaterally V,VII: smile symmetric, facial light touch sensation normal bilaterally VIII: hearing normal bilaterally IX,X: gag reflex present XI: bilateral shoulder shrug XII: midline tongue extension Motor: Right : Upper extremity   5/5    Left:     Upper extremity   5/5  Lower extremity   5/5     Lower extremity   5/5 Tone and bulk:normal tone throughout; no atrophy noted Sensory: Pinprick and light touch intact throughout, bilaterally Deep Tendon Reflexes: 2+ and symmetric throughout Plantars: Right: downgoing   Left: downgoing Cerebellar: Essential tremor bilaterally  Gait: not tested      Laboratory Studies:   Basic Metabolic Panel: Recent Labs  Lab 08/26/18 0546 08/27/18 0444  NA 137 132*  K 3.5 3.8  CL 97* 95*  CO2 24 26  GLUCOSE 199* 217*  BUN 6 9  CREATININE 0.50* 0.55*  CALCIUM 8.5* 9.1  MG 2.1  --     Liver Function Tests: Recent Labs  Lab 08/26/18 0546  AST 32  ALT 29  ALKPHOS 55  BILITOT 0.7  PROT 7.9  ALBUMIN 4.5   No results for input(s): LIPASE, AMYLASE in the last 168 hours. No results for input(s): AMMONIA in the last 168 hours.  CBC: Recent Labs  Lab 08/26/18 0546  WBC 4.9  NEUTROABS 3.0  HGB 15.6  HCT 44.4  MCV 86.9  PLT 179    Cardiac Enzymes: No results for input(s): CKTOTAL, CKMB, CKMBINDEX,  TROPONINI in the last 168 hours.  BNP: Invalid input(s): POCBNP  CBG: Recent Labs  Lab 08/26/18 0544  GLUCAP 186*    Microbiology: Results for orders placed or performed during the hospital encounter of 08/26/18  SARS  Coronavirus 2 (CEPHEID - Performed in [facility] hospital lab), Hosp Order     Status: None   Collection Time: 08/26/18 10:13 AM   Specimen: Nasopharyngeal Swab  Result Value Ref Range Status   SARS Coronavirus 2 NEGATIVE NEGATIVE Final    Comment: (NOTE) If result is NEGATIVE SARS-CoV-2 target nucleic acids are NOT DETECTED. The SARS-CoV-2 RNA is generally detectable in upper and lower  respiratory specimens during the acute phase of infection. The lowest  concentration of SARS-CoV-2 viral copies this assay can detect is 250  copies / mL. A negative result does not preclude SARS-CoV-2 infection  and should not be used as the sole basis for treatment or other  patient management decisions.  A negative result may occur with  improper specimen collection / handling, submission of specimen other  than nasopharyngeal swab, presence of viral mutation(s) within the  areas targeted by this assay, and inadequate number of viral copies  (<250 copies / mL). A negative result must be combined with clinical  observations, patient history, and epidemiological information. If result is POSITIVE SARS-CoV-2 target nucleic acids are DETECTED. The SARS-CoV-2 RNA is generally detectable in upper and lower  respiratory specimens dur ing the acute phase of infection.  Positive  results are indicative of active infection with SARS-CoV-2.  Clinical  correlation with patient history and other diagnostic information is  necessary to determine patient infection status.  Positive results do  not rule out bacterial infection or co-infection with other viruses. If result is PRESUMPTIVE POSTIVE SARS-CoV-2 nucleic acids MAY BE PRESENT.   A presumptive positive result was obtained on the submitted specimen  and confirmed on repeat testing.  While 2019 novel coronavirus  (SARS-CoV-2) nucleic acids may be present in the submitted sample  additional confirmatory testing may be necessary for epidemiological  and / or  clinical management purposes  to differentiate between  SARS-CoV-2 and other Sarbecovirus currently known to infect humans.  If clinically indicated additional testing with an alternate test  methodology [phone] is advised. The SARS-CoV-2 RNA is generally  detectable in upper and lower respiratory sp ecimens during the acute  phase of infection. The expected result is Negative. Fact Sheet for Patients:  BoilerBrush.com.cyhttps://www.fda.gov/media/136312/download Fact Sheet for Healthcare Providers: https://pope.com/https://www.fda.gov/media/136313/download This test is not yet approved or cleared by the [place] FDA and has been authorized for detection and/or diagnosis of SARS-CoV-2 by FDA under an Emergency Use Authorization (EUA).  This EUA will remain in effect (meaning this test can be used) for the duration of the COVID-19 declaration under Section 564(b)(1) of the Act, 21 U.S.C. section 360bbb-3(b)(1), unless the authorization is terminated or revoked sooner. Performed at [facility], [address] [place], [place] [address]   Culture, blood (routine x 2)     Status: None (Preliminary result)   Collection Time: 08/26/18 10:43 AM   Specimen: BLOOD  Result Value Ref Range Status   Specimen Description   Final    BLOOD LEFT HAND Performed at [facility], [address] [place], [place] [address]    Special Requests   Final    BOTTLES DRAWN AEROBIC AND ANAEROBIC Blood Culture adequate volume Performed at [facility], [address] [place], [place] [address]  Culture  Setup Time   Final    GRAM POSITIVE COCCI ANAEROBIC BOTTLE ONLY CRITICAL RESULT CALLED TO, READ BACK BY AND VERIFIED WITHCher Nakai: SHEEMA HALLAJI PHARMD [id number] 08/27/2018 HNM Performed at [facility], [address] [place], [place] [address]    Culture GRAM POSITIVE COCCI  Final   Report Status PENDING  Incomplete  Blood Culture ID Panel (Reflexed)     Status: Abnormal   Collection Time: 08/26/18  10:43 AM  Result Value Ref Range Status   Enterococcus species NOT DETECTED NOT DETECTED Final   Listeria monocytogenes NOT DETECTED NOT DETECTED Final   Staphylococcus species DETECTED (A) NOT DETECTED Final    Comment: Methicillin (oxacillin) susceptible coagulase negative staphylococcus. Possible blood culture contaminant (unless isolated from more than one blood culture draw or clinical case suggests pathogenicity). No antibiotic treatment is indicated for blood  culture contaminants. CRITICAL RESULT CALLED TO, READ BACK BY AND VERIFIED WITH: Cher NakaiSHEEMA HALLAJI PAHRMD [id number] 08/27/2018 HNM    Staphylococcus aureus (BCID) NOT DETECTED NOT DETECTED Final   Methicillin resistance NOT DETECTED NOT DETECTED Final   Streptococcus species NOT DETECTED NOT DETECTED Final   Streptococcus agalactiae NOT DETECTED NOT DETECTED Final   Streptococcus pneumoniae NOT DETECTED NOT DETECTED Final   Streptococcus pyogenes NOT DETECTED NOT DETECTED Final   Acinetobacter baumannii NOT DETECTED NOT DETECTED Final   Enterobacteriaceae species NOT DETECTED NOT DETECTED Final   Enterobacter cloacae complex NOT DETECTED NOT DETECTED Final   Escherichia coli NOT DETECTED NOT DETECTED Final   Klebsiella oxytoca NOT DETECTED NOT DETECTED Final   Klebsiella pneumoniae NOT DETECTED NOT DETECTED Final   Proteus species NOT DETECTED NOT DETECTED Final   Serratia marcescens NOT DETECTED NOT DETECTED Final   Haemophilus influenzae NOT DETECTED NOT DETECTED Final   Neisseria meningitidis NOT DETECTED NOT DETECTED Final   Pseudomonas aeruginosa NOT DETECTED NOT DETECTED Final   Candida albicans NOT DETECTED NOT DETECTED Final   Candida glabrata NOT DETECTED NOT DETECTED Final   Candida krusei NOT DETECTED NOT DETECTED Final   Candida parapsilosis NOT DETECTED NOT DETECTED Final   Candida tropicalis NOT DETECTED NOT DETECTED Final    Comment: Performed at [facility], [address] [place], [place] [address]     Coagulation Studies: No results for input(s): LABPROT, INR in the last 72 hours.  Urinalysis:  Recent Labs  Lab 08/26/18 0533  COLORURINE YELLOW*  LABSPEC 1.012  PHURINE 5.0  GLUCOSEU 50*  HGBUR SMALL*  BILIRUBINUR NEGATIVE  KETONESUR 5*  PROTEINUR 30*  NITRITE NEGATIVE  LEUKOCYTESUR NEGATIVE    Lipid Panel:  No results found for: CHOL, TRIG, HDL, CHOLHDL, VLDL, LDLCALC  HgbA1C: No results found for: HGBA1C  Urine Drug Screen:      Component Value Date/Time   LABOPIA NONE DETECTED 08/26/2018 0533   COCAINSCRNUR POSITIVE (A) 08/26/2018 0533   LABBENZ NONE DETECTED 08/26/2018 0533   AMPHETMU NONE DETECTED 08/26/2018 0533   THCU NONE DETECTED 08/26/2018 0533   LABBARB NONE DETECTED 08/26/2018 0533    Alcohol Level:  Recent Labs  Lab 08/26/18 0546  ETH 241*    Other results: EKG: normal EKG, normal sinus rhythm, unchanged from previous tracings.  Imaging: Ct Head Wo Contrast  Result Date: 08/26/2018 CLINICAL DATA:  Headache, acute EXAM: CT HEAD WITHOUT CONTRAST TECHNIQUE: Contiguous axial images were obtained from the base of the skull through the vertex without intravenous contrast. COMPARISON:  None. FINDINGS: Brain: No evidence of acute infarction, hemorrhage, hydrocephalus, extra-axial collection  or mass lesion/mass effect. Vascular: No hyperdense vessel or unexpected calcification. Skull: Normal. Negative for fracture or focal lesion. Sinuses/Orbits: No acute finding. IMPRESSION: Negative head CT. Electronically Signed   By: Marnee SpringJonathon  Watts M.D.   On: 08/26/2018 06:23   Mr Brain Wo Contrast  Result Date: 08/26/2018 CLINICAL DATA:  Initial evaluation for acute severe headache. EXAM: MRI HEAD WITHOUT CONTRAST TECHNIQUE: Multiplanar, multiecho pulse sequences of the brain and surrounding structures were obtained without intravenous contrast. COMPARISON:  Prior CT from earlier the same day. FINDINGS: Brain: Cerebral volume within normal limits for patient age. No  significant cerebral white matter changes. No abnormal foci of restricted diffusion to suggest acute or subacute ischemia. Gray-white matter differentiation well maintained. No encephalomalacia to suggest chronic infarction. No foci of susceptibility artifact to suggest acute or chronic intracranial hemorrhage. No mass lesion, midline shift or mass effect. No hydrocephalus. No extra-axial fluid collection. Major dural sinuses are grossly patent. Pituitary gland and suprasellar region are normal. Midline structures intact and normal. Vascular: Major intracranial vascular flow voids well maintained and normal in appearance. Skull and upper cervical spine: Craniocervical junction normal. Visualized upper cervical spine within normal limits. Bone marrow signal intensity normal. No scalp soft tissue abnormality. Sinuses/Orbits: Globes and orbital soft tissues within normal limits. Paranasal sinuses are clear. No mastoid effusion. Inner ear structures normal. Other: None. IMPRESSION: Normal brain MRI.  No acute intracranial abnormality identified. Electronically Signed   By: Rise MuBenjamin  McClintock M.D.   On: 08/26/2018 15:25     Assessment/Plan:  48 y.o. male with a known history of daily EtOH abuse and essential hypertension who takes medications on a as needed basis who presents to the emergency room due to headache.  Patient reports that he has had chronic headaches since age 48 however over the past few days his headache has not improved with Tylenol.  He denies photophobia, neck stiffness or fevers. Headaches described as frontal and do not radiate. Last ETOH use 3 AM day prior  - CTH and MRI no acute abnormlities - s/p thiamine 500mg  IV x 1  - s/p decadron x 2 - d/c planning.  - thiamine and follic acid as out pt.    08/27/2018, 10:56 AM

## 2018-08-27 NOTE — Discharge Summary (Signed)
[facility] - [place] [facility]   PATIENT NAME: Christopher Ayala    MR#:  [id number]  DATE OF BIRTH:  February 23, 1971  DATE OF ADMISSION:  08/26/2018 ADMITTING PHYSICIAN: Adrian SaranSital Kalasia Crafton, MD  DATE OF DISCHARGE: 08/27/2018  PRIMARY CARE PHYSICIAN: Patient, No Pcp Per    ADMISSION DIAGNOSIS:  Alcohol abuse [F10.10] Severe headache [R51] Elevated lactic acid level [R79.89]  DISCHARGE DIAGNOSIS:  Active Problems:   ETOH abuse   SECONDARY DIAGNOSIS:   Past Medical History:  Diagnosis Date  . Hyperlipidemia   . Hypertension   . Lyme disease     HOSPITAL COURSE:   48 year old male with history of EtOH abuse who presented with headache and found to have persistent lactic acidosis.  1.  Headache: This headache is resolved.  I am suspecting headache was caused due to cocaine use with elevated blood pressure due to cocaine use.  MRI was negative for acute pathology.  He was evaluated by neurology.  2.  Lactic acidosis  from severe dehydration with EtOH abuse There were no signs of acute infection Lactic acid level has improved.  3.  EtOH abuse: Patient had uneventful detox while in the hospital.    Patient counseled on cutting down/quitting EtOH  4.  Essential hypertension: He will need to continue oral dose of lisinopril/HCTZ.  DISCHARGE CONDITIONS AND DIET:   Stable for discharge regular diet  CONSULTS OBTAINED:  Treatment Team:  Pauletta BrownsZeylikman, Yuriy, MD Thana Farreynolds, Leslie, MD  DRUG ALLERGIES:  No Known Allergies  DISCHARGE MEDICATIONS:   Allergies as of 08/27/2018   No Known Allergies     Medication List    TAKE these medications   folic acid 1 MG tablet Commonly known as: FOLVITE Take 1 tablet (1 mg total) by mouth daily. Start taking on: August 28, 2018   lisinopril-hydrochlorothiazide 10-12.5 MG tablet Commonly known as: ZESTORETIC Take 1 tablet by mouth daily.   thiamine 100 MG tablet Commonly known as: VITAMIN B-1 Take 1 tablet (100  mg total) by mouth daily.         Today   CHIEF COMPLAINT:   Headache has improved.   VITAL SIGNS:  Blood pressure (!) 142/97, pulse 95, temperature 98 F (36.7 C), temperature source Oral, resp. rate 14, height 5\' 6"  (1.676 m), weight 81.6 kg, SpO2 95 %.   REVIEW OF SYSTEMS:  Review of Systems  Constitutional: Negative.  Negative for chills, fever and malaise/fatigue.  HENT: Negative.  Negative for ear discharge, ear pain, hearing loss, nosebleeds and sore throat.   Eyes: Negative.  Negative for blurred vision and pain.  Respiratory: Negative.  Negative for cough, hemoptysis, shortness of breath and wheezing.   Cardiovascular: Negative.  Negative for chest pain, palpitations and leg swelling.  Gastrointestinal: Negative.  Negative for abdominal pain, blood in stool, diarrhea, nausea and vomiting.  Genitourinary: Negative.  Negative for dysuria.  Musculoskeletal: Negative.  Negative for back pain.  Skin: Negative.   Neurological: Negative for dizziness, tremors, speech change, focal weakness, seizures and headaches.  Endo/Heme/Allergies: Negative.  Does not bruise/bleed easily.  Psychiatric/Behavioral: Negative.  Negative for depression, hallucinations and suicidal ideas.     PHYSICAL EXAMINATION:  GENERAL:  48 y.o.-year-old patient lying in the bed with no acute distress.  NECK:  Supple, no jugular venous distention. No thyroid enlargement, no tenderness.  LUNGS: Normal breath sounds bilaterally, no wheezing, rales,rhonchi  No use of accessory muscles of respiration.  CARDIOVASCULAR: S1, S2 normal. No murmurs, rubs, or gallops.  ABDOMEN:  Soft, non-tender, non-distended. Bowel sounds present. No organomegaly or mass.  EXTREMITIES: No pedal edema, cyanosis, or clubbing.  PSYCHIATRIC: The patient is alert and oriented x 3.  SKIN: No obvious rash, lesion, or ulcer.   DATA REVIEW:   CBC Recent Labs  Lab 08/26/18 0546  WBC 4.9  HGB 15.6  HCT 44.4  PLT 179     Chemistries  Recent Labs  Lab 08/26/18 0546 08/27/18 0444  NA 137 132*  K 3.5 3.8  CL 97* 95*  CO2 24 26  GLUCOSE 199* 217*  BUN 6 9  CREATININE 0.50* 0.55*  CALCIUM 8.5* 9.1  MG 2.1  --   AST 32  --   ALT 29  --   ALKPHOS 55  --   BILITOT 0.7  --     Cardiac Enzymes No results for input(s): TROPONINI in the last 168 hours.  Microbiology Results  @MICRORSLT48 @  RADIOLOGY:  Ct Head Wo Contrast  Result Date: 08/26/2018 CLINICAL DATA:  Headache, acute EXAM: CT HEAD WITHOUT CONTRAST TECHNIQUE: Contiguous axial images were obtained from the base of the skull through the vertex without intravenous contrast. COMPARISON:  None. FINDINGS: Brain: No evidence of acute infarction, hemorrhage, hydrocephalus, extra-axial collection or mass lesion/mass effect. Vascular: No hyperdense vessel or unexpected calcification. Skull: Normal. Negative for fracture or focal lesion. Sinuses/Orbits: No acute finding. IMPRESSION: Negative head CT. Electronically Signed   By: Monte Fantasia M.D.   On: 08/26/2018 06:23   Mr Brain Wo Contrast  Result Date: 08/26/2018 CLINICAL DATA:  Initial evaluation for acute severe headache. EXAM: MRI HEAD WITHOUT CONTRAST TECHNIQUE: Multiplanar, multiecho pulse sequences of the brain and surrounding structures were obtained without intravenous contrast. COMPARISON:  Prior CT from earlier the same day. FINDINGS: Brain: Cerebral volume within normal limits for patient age. No significant cerebral white matter changes. No abnormal foci of restricted diffusion to suggest acute or subacute ischemia. Gray-white matter differentiation well maintained. No encephalomalacia to suggest chronic infarction. No foci of susceptibility artifact to suggest acute or chronic intracranial hemorrhage. No mass lesion, midline shift or mass effect. No hydrocephalus. No extra-axial fluid collection. Major dural sinuses are grossly patent. Pituitary gland and suprasellar region are normal.  Midline structures intact and normal. Vascular: Major intracranial vascular flow voids well maintained and normal in appearance. Skull and upper cervical spine: Craniocervical junction normal. Visualized upper cervical spine within normal limits. Bone marrow signal intensity normal. No scalp soft tissue abnormality. Sinuses/Orbits: Globes and orbital soft tissues within normal limits. Paranasal sinuses are clear. No mastoid effusion. Inner ear structures normal. Other: None. IMPRESSION: Normal brain MRI.  No acute intracranial abnormality identified. Electronically Signed   By: Jeannine Boga M.D.   On: 08/26/2018 15:25      Allergies as of 08/27/2018   No Known Allergies     Medication List    TAKE these medications   folic acid 1 MG tablet Commonly known as: FOLVITE Take 1 tablet (1 mg total) by mouth daily. Start taking on: August 28, 2018   lisinopril-hydrochlorothiazide 10-12.5 MG tablet Commonly known as: ZESTORETIC Take 1 tablet by mouth daily.   thiamine 100 MG tablet Commonly known as: VITAMIN B-1 Take 1 tablet (100 mg total) by mouth daily.          Management plans discussed with the patient and he is in agreement. Stable for discharge   Patient should follow up with pcp  CODE STATUS:     Code Status Orders  (From admission, onward)  Start     Ordered   08/26/18 1417  Full code  Continuous     08/26/18 1416        Code Status History    Date Active Date Inactive Code Status Order ID Comments User Context   08/26/2018 0959 08/26/2018 1416 Full Code [id number]  Jeanmarie PlantMcShane, James A, MD ED   Advance Care Planning Activity      TOTAL TIME TAKING CARE OF THIS PATIENT: 38 minutes.    Note: This dictation was prepared with Dragon dictation along with smaller phrase technology. Any transcriptional errors that result from this process are unintentional.  Adrian SaranSital Larrisa Cravey M.D on 08/27/2018 at 9:47 AM  Between 7am to 6pm - Pager - [phone] After 6pm go  to [email or web address] - password [email or web address]  [facility]  Office  [phone]  CC: Primary care physician; Patient, No Pcp Per

## 2018-08-27 NOTE — Progress Notes (Signed)
PHARMACY - PHYSICIAN COMMUNICATION CRITICAL VALUE ALERT - BLOOD CULTURE IDENTIFICATION (BCID)  Christopher Ayala is an 48 y.o. male who presented to [place] on 08/26/2018 with a chief complaint of EtOH withdrawal.  Assessment:  1/4 bottles positive for GNR Staph Species, mecA(-) (include suspected source if known)  Name of physician (or Provider) Contacted: Dr. Benjie Karvonen  Current antibiotics: none  Changes to prescribed antibiotics recommended:  Recommendations accepted by provider. Appears to be possible contaminant. Will continue to incubate cultures and await any possible growth. No antibiotics have been ordered.  Results for orders placed or performed during the hospital encounter of 08/26/18  Blood Culture ID Panel (Reflexed) (Collected: 08/26/2018 10:43 AM)  Result Value Ref Range   Enterococcus species NOT DETECTED NOT DETECTED   Listeria monocytogenes NOT DETECTED NOT DETECTED   Staphylococcus species DETECTED (A) NOT DETECTED   Staphylococcus aureus (BCID) NOT DETECTED NOT DETECTED   Methicillin resistance NOT DETECTED NOT DETECTED   Streptococcus species NOT DETECTED NOT DETECTED   Streptococcus agalactiae NOT DETECTED NOT DETECTED   Streptococcus pneumoniae NOT DETECTED NOT DETECTED   Streptococcus pyogenes NOT DETECTED NOT DETECTED   Acinetobacter baumannii NOT DETECTED NOT DETECTED   Enterobacteriaceae species NOT DETECTED NOT DETECTED   Enterobacter cloacae complex NOT DETECTED NOT DETECTED   Escherichia coli NOT DETECTED NOT DETECTED   Klebsiella oxytoca NOT DETECTED NOT DETECTED   Klebsiella pneumoniae NOT DETECTED NOT DETECTED   Proteus species NOT DETECTED NOT DETECTED   Serratia marcescens NOT DETECTED NOT DETECTED   Haemophilus influenzae NOT DETECTED NOT DETECTED   Neisseria meningitidis NOT DETECTED NOT DETECTED   Pseudomonas aeruginosa NOT DETECTED NOT DETECTED   Candida albicans NOT DETECTED NOT DETECTED   Candida glabrata NOT DETECTED NOT DETECTED   Candida krusei NOT DETECTED NOT DETECTED   Candida parapsilosis NOT DETECTED NOT DETECTED   Candida tropicalis NOT DETECTED NOT DETECTED    [name] [name] 08/27/2018  7:49 AM

## 2018-08-28 LAB — HIV ANTIBODY (ROUTINE TESTING W REFLEX): HIV Screen 4th Generation wRfx: NONREACTIVE

## 2018-08-29 LAB — CULTURE, BLOOD (ROUTINE X 2): Special Requests: ADEQUATE

## 2018-08-31 LAB — CULTURE, BLOOD (ROUTINE X 2): Culture: NO GROWTH

## 2020-06-12 ENCOUNTER — Emergency Department
Admission: EM | Admit: 2020-06-12 | Discharge: 2020-06-12 | Discharge status: Absent without leave | Payer: BLUE CROSS/BLUE SHIELD

## 2020-06-12 NOTE — ED Triage Notes (Signed)
Pt states that he smoked some week and thinks it was laced with something.

## 2021-01-06 ENCOUNTER — Other Ambulatory Visit: Payer: Self-pay

## 2021-01-06 ENCOUNTER — Emergency Department
Admission: EM | Admit: 2021-01-06 | Discharge: 2021-01-06 | Disposition: A | Discharge status: Home / Self Care | Payer: BLUE CROSS/BLUE SHIELD | Attending: Student | Admitting: Student

## 2021-01-06 ENCOUNTER — Encounter: Payer: Self-pay | Admitting: Emergency Medicine

## 2021-01-06 DIAGNOSIS — Z5321 Procedure and treatment not carried out due to patient leaving prior to being seen by health care provider: Secondary | ICD-10-CM | POA: Diagnosis not present

## 2021-01-06 DIAGNOSIS — R42 Dizziness and giddiness: Secondary | ICD-10-CM | POA: Insufficient documentation

## 2021-01-06 DIAGNOSIS — R55 Syncope and collapse: Secondary | ICD-10-CM | POA: Diagnosis not present

## 2021-01-06 LAB — CBC
HCT: 46.6 % (ref 39.0–52.0)
Hemoglobin: 16.6 g/dL (ref 13.0–17.0)
MCH: 31.4 pg (ref 26.0–34.0)
MCHC: 35.6 g/dL (ref 30.0–36.0)
MCV: 88.3 fL (ref 80.0–100.0)
Platelets: 207 10*3/uL (ref 150–400)
RBC: 5.28 MIL/uL (ref 4.22–5.81)
RDW: 12.4 % (ref 11.5–15.5)
WBC: 7.2 10*3/uL (ref 4.0–10.5)
nRBC: 0 % (ref 0.0–0.2)

## 2021-01-06 LAB — COMPREHENSIVE METABOLIC PANEL
ALT: 54 U/L — ABNORMAL HIGH (ref 0–44)
AST: 50 U/L — ABNORMAL HIGH (ref 15–41)
Albumin: 4.7 g/dL (ref 3.5–5.0)
Alkaline Phosphatase: 52 U/L (ref 38–126)
Anion gap: 16 — ABNORMAL HIGH (ref 5–15)
BUN: 7 mg/dL (ref 6–20)
CO2: 26 mmol/L (ref 22–32)
Calcium: 8.7 mg/dL — ABNORMAL LOW (ref 8.9–10.3)
Chloride: 96 mmol/L — ABNORMAL LOW (ref 98–111)
Creatinine, Ser: 0.53 mg/dL — ABNORMAL LOW (ref 0.61–1.24)
GFR, Estimated: >60 mL/min (ref >60)
Glucose, Bld: 150 mg/dL — ABNORMAL HIGH (ref 70–99)
Potassium: 3.7 mmol/L (ref 3.5–5.1)
Sodium: 138 mmol/L (ref 135–145)
Total Bilirubin: 1 mg/dL (ref 0.3–1.2)
Total Protein: 8.1 g/dL (ref 6.5–8.1)

## 2021-01-06 MED ORDER — SODIUM CHLORIDE 0.9 % IV BOLUS
1000.0000 mL | Freq: Once | INTRAVENOUS | Status: AC
  Administered 2021-01-06: 1000 mL via INTRAVENOUS

## 2021-01-06 NOTE — ED Triage Notes (Signed)
Pt in via EMS from home with c/o headache for 3 days. Pt has been talking 1 g of tylenol with no relief. 158/97, 99% RA, 96 HR. Pt has been non compliant with meds for 3 days and admits to having ETOH on board. Pt with #22g to left hand

## 2021-01-06 NOTE — ED Notes (Signed)
Patient requesting to have his IV removed and leave without further treatment. Patient encouraged to stay. Patient continued to report desire to leave. IV removed. Patient left.

## 2021-01-06 NOTE — ED Triage Notes (Signed)
Pt reports HA to the back of his head for 3 days unrelieved by OTC meds. Pt also reports has been drinking rum for 3 days as well.

## 2021-01-06 NOTE — ED Provider Notes (Signed)
Emergency Medicine Provider Triage Evaluation Note  [facility] Christopher Ayala , a 50 y.o. male  was evaluated in triage.  Pt complains of pt was asking to leave and IV was removed by nurse - Pt began to walk and became dizzy with near syncopal episode Pt has been drinking for the past 3 days - His vitals show elevated HR and BP.  Review of Systems  Positive: Near syncope, HA, dizziness Negative: Falls, vision changes, N/V  Physical Exam  BP (!) 151/101   Pulse (!) 113   Temp 97.9 F (36.6 C) (Oral)   Resp 18   Ht 5\' 6"  (1.676 m)   Wt 77.1 kg   SpO2 98%   BMI 27.44 kg/m  Gen:   Awake, near syncopal episode  Resp:  Normal effort  MSK:   Moves extremities without difficulty weak Other:    Medical Decision Making  Medically screening exam initiated at 4:32 PM.  Appropriate orders placed.  [name] was informed that the remainder of the evaluation will be completed by another provider, this initial triage assessment does not replace that evaluation, and the importance of remaining in the ED until their evaluation is complete.  First nurse had placed lab orders We will restart IV and admin fluids    [employer], NP 01/06/21 2244    13/05/22, MD 01/06/21 2246

## 2021-03-01 ENCOUNTER — Emergency Department
Admission: EM | Admit: 2021-03-01 | Discharge: 2021-03-01 | Disposition: A | Discharge status: Home / Self Care | Payer: BLUE CROSS/BLUE SHIELD | Attending: Emergency Medicine | Admitting: Emergency Medicine

## 2021-03-01 ENCOUNTER — Other Ambulatory Visit: Payer: Self-pay

## 2021-03-01 ENCOUNTER — Encounter: Payer: Self-pay | Admitting: Emergency Medicine

## 2021-03-01 ENCOUNTER — Emergency Department: Payer: BLUE CROSS/BLUE SHIELD

## 2021-03-01 DIAGNOSIS — I10 Essential (primary) hypertension: Secondary | ICD-10-CM | POA: Diagnosis not present

## 2021-03-01 DIAGNOSIS — H53149 Visual discomfort, unspecified: Secondary | ICD-10-CM | POA: Diagnosis not present

## 2021-03-01 DIAGNOSIS — Z79899 Other long term (current) drug therapy: Secondary | ICD-10-CM | POA: Insufficient documentation

## 2021-03-01 DIAGNOSIS — Y908 Blood alcohol level of 240 mg/100 ml or more: Secondary | ICD-10-CM | POA: Insufficient documentation

## 2021-03-01 DIAGNOSIS — R519 Headache, unspecified: Secondary | ICD-10-CM | POA: Insufficient documentation

## 2021-03-01 DIAGNOSIS — F1010 Alcohol abuse, uncomplicated: Secondary | ICD-10-CM | POA: Diagnosis not present

## 2021-03-01 LAB — CBC WITH DIFFERENTIAL/PLATELET
Abs Immature Granulocytes: 0.01 10*3/uL (ref 0.00–0.07)
Basophils Absolute: 0 10*3/uL (ref 0.0–0.1)
Basophils Relative: 0 %
Eosinophils Absolute: 0 10*3/uL (ref 0.0–0.5)
Eosinophils Relative: 1 %
HCT: 44.6 % (ref 39.0–52.0)
Hemoglobin: 15.8 g/dL (ref 13.0–17.0)
Immature Granulocytes: 0 %
Lymphocytes Relative: 34 %
Lymphs Abs: 2.3 10*3/uL (ref 0.7–4.0)
MCH: 31 pg (ref 26.0–34.0)
MCHC: 35.4 g/dL (ref 30.0–36.0)
MCV: 87.5 fL (ref 80.0–100.0)
Monocytes Absolute: 0.4 10*3/uL (ref 0.1–1.0)
Monocytes Relative: 5 %
Neutro Abs: 4.1 10*3/uL (ref 1.7–7.7)
Neutrophils Relative %: 60 %
Platelets: 197 10*3/uL (ref 150–400)
RBC: 5.1 MIL/uL (ref 4.22–5.81)
RDW: 12.5 % (ref 11.5–15.5)
WBC: 6.8 10*3/uL (ref 4.0–10.5)
nRBC: 0 % (ref 0.0–0.2)

## 2021-03-01 LAB — COMPREHENSIVE METABOLIC PANEL
ALT: 36 U/L (ref 0–44)
AST: 39 U/L (ref 15–41)
Albumin: 4.5 g/dL (ref 3.5–5.0)
Alkaline Phosphatase: 52 U/L (ref 38–126)
Anion gap: 14 (ref 5–15)
BUN: 10 mg/dL (ref 6–20)
CO2: 25 mmol/L (ref 22–32)
Calcium: 8.6 mg/dL — ABNORMAL LOW (ref 8.9–10.3)
Chloride: 98 mmol/L (ref 98–111)
Creatinine, Ser: 0.55 mg/dL — ABNORMAL LOW (ref 0.61–1.24)
GFR, Estimated: >60 mL/min (ref >60)
Glucose, Bld: 154 mg/dL — ABNORMAL HIGH (ref 70–99)
Potassium: 3.7 mmol/L (ref 3.5–5.1)
Sodium: 137 mmol/L (ref 135–145)
Total Bilirubin: 1 mg/dL (ref 0.3–1.2)
Total Protein: 7.7 g/dL (ref 6.5–8.1)

## 2021-03-01 LAB — ETHANOL: Alcohol, Ethyl (B): 244 mg/dL — ABNORMAL HIGH (ref <10)

## 2021-03-01 MED ORDER — PROCHLORPERAZINE EDISYLATE 10 MG/2ML IJ SOLN
10.0000 mg | Freq: Once | INTRAMUSCULAR | Status: AC
  Administered 2021-03-01: 08:00:00 10 mg via INTRAVENOUS
  Filled 2021-03-01: qty 2

## 2021-03-01 MED ORDER — MAGNESIUM SULFATE 2 GM/50ML IV SOLN
2.0000 g | Freq: Once | INTRAVENOUS | Status: AC
  Administered 2021-03-01: 08:00:00 2 g via INTRAVENOUS
  Filled 2021-03-01: qty 50

## 2021-03-01 MED ORDER — KETOROLAC TROMETHAMINE 30 MG/ML IJ SOLN
15.0000 mg | Freq: Once | INTRAMUSCULAR | Status: AC
  Administered 2021-03-01: 08:00:00 15 mg via INTRAVENOUS
  Filled 2021-03-01: qty 1

## 2021-03-01 MED ORDER — LACTATED RINGERS IV BOLUS
1000.0000 mL | Freq: Once | INTRAVENOUS | Status: AC
  Administered 2021-03-01: 08:00:00 1000 mL via INTRAVENOUS

## 2021-03-01 NOTE — ED Triage Notes (Addendum)
Pt to triage via w/c with no distress noted; pt reports since yesterday having occipital HA with no accomp symptoms; st hx of same and has "been drinking a lot of alcohol"

## 2021-03-01 NOTE — ED Provider Notes (Signed)
[facility] Emergency Department Provider Note  ____________________________________________   Event Date/Time   First MD Initiated Contact with Patient 03/01/21 0725     (approximate)  I have reviewed the triage vital signs and the nursing notes.   HISTORY  Chief Complaint Headache   HPI Christopher Ayala is a 50 y.o. male with below noted past medical history including a seems alcohol use disorder patient denying regular alcohol use endorsing primarily binge drinking stating he is going to emergency room today because he has had a severe headache for the last 2 or 3 days and has been drinking quite heavily during this time.  He states that headache is primarily in the back of his head radiates down towards his neck.  He denies any eye pain, sore throat, earache, fevers, chills, rash, chest pain, cough, shortness of breath, nausea, vomiting, diarrhea, burning with urination, abdominal pain, back pain extremity pain or extremity weakness numbness or tingling.  Denies any falls or injuries, illicit drug use or other acute complaints.  States he had some Tylenol earlier but this did not help much.  No other acute concerns at this time.         Past Medical History:  Diagnosis Date   Hyperlipidemia    Hypertension    Lyme disease     Patient Active Problem List   Diagnosis Date Noted   ETOH abuse 08/26/2018    History reviewed. No pertinent surgical history.  Prior to Admission medications   Medication Sig Start Date End Date Taking? Authorizing Provider  folic acid (FOLVITE) 1 MG tablet Take 1 tablet (1 mg total) by mouth daily. 08/28/18   Adrian Saran, MD  lisinopril-hydrochlorothiazide (ZESTORETIC) 10-12.5 MG tablet Take 1 tablet by mouth daily.    [provider]  thiamine (VITAMIN B-1) 100 MG tablet Take 1 tablet (100 mg total) by mouth daily. 08/27/18   Adrian Saran, MD    Allergies Patient has no known allergies.  No family  history on file.  Social History Social History   Tobacco Use   Smoking status: Never   Smokeless tobacco: Never  Vaping Use   Vaping Use: Never used  Substance Use Topics   Alcohol use: Yes   Drug use: Never    Review of Systems  Review of Systems  Constitutional:  Negative for chills and fever.  HENT:  Negative for sore throat.   Eyes:  Positive for photophobia. Negative for pain.  Respiratory:  Negative for cough and stridor.   Cardiovascular:  Negative for chest pain.  Gastrointestinal:  Negative for vomiting.  Genitourinary:  Negative for dysuria.  Musculoskeletal:  Negative for myalgias.  Skin:  Negative for rash.  Neurological:  Positive for headaches. Negative for seizures and loss of consciousness.  Psychiatric/Behavioral:  Negative for suicidal ideas.   All other systems reviewed and are negative.    ____________________________________________   PHYSICAL EXAM:  VITAL SIGNS: ED Triage Vitals  Enc Vitals Group     BP 03/01/21 0422 128/88     Pulse Rate 03/01/21 0422 93     Resp 03/01/21 0422 20     Temp 03/01/21 0422 98.2 F (36.8 C)     Temp Source 03/01/21 0422 Oral     SpO2 03/01/21 0422 94 %     Weight 03/01/21 0427 170 lb (77.1 kg)     Height --      Head Circumference --      Peak Flow --  Pain Score 03/01/21 0427 10     Pain Loc --      Pain Edu? --      Excl. in GC? --    Vitals:   03/01/21 0422 03/01/21 0745  BP: 128/88 119/74  Pulse: 93 75  Resp: 20 16  Temp: 98.2 F (36.8 C)   SpO2: 94% 96%   Physical Exam Vitals and nursing note reviewed.  Constitutional:      General: He is not in acute distress.    Appearance: He is well-developed.  HENT:     Head: Normocephalic and atraumatic.     Right Ear: External ear normal.     Left Ear: External ear normal.     Nose: Nose normal.     Mouth/Throat:     Mouth: Mucous membranes are dry.  Eyes:     Conjunctiva/sclera: Conjunctivae normal.  Cardiovascular:     Rate and  Rhythm: Normal rate and regular rhythm.     Heart sounds: No murmur heard. Pulmonary:     Effort: Pulmonary effort is normal. No respiratory distress.     Breath sounds: Normal breath sounds.  Abdominal:     Palpations: Abdomen is soft.     Tenderness: There is no abdominal tenderness.  Musculoskeletal:        General: No swelling.     Cervical back: Neck supple. No rigidity.  Skin:    General: Skin is warm and dry.     Capillary Refill: Capillary refill takes 2 to 3 seconds.  Neurological:     Mental Status: He is alert and oriented to person, place, and time.  Psychiatric:        Mood and Affect: Mood normal.    Cranial nerves II through XII grossly intact.  No pronator drift.  No finger dysmetria.  Symmetric 5/5 strength of all extremities.  Sensation intact to light touch in all extremities.  Unremarkable unassisted gait.  No tenderness along the C-spine or evidence of trauma to the face scalp head or neck.  2+ radial pulse.  Extremities are unremarkable.   ____________________________________________   LABS (all labs ordered are listed, but only abnormal results are displayed)  Labs Reviewed  ETHANOL - Abnormal; Notable for the following components:      Result Value   Alcohol, Ethyl (B) 244 (*)    All other components within normal limits  COMPREHENSIVE METABOLIC PANEL - Abnormal; Notable for the following components:   Glucose, Bld 154 (*)    Creatinine, Ser 0.55 (*)    Calcium 8.6 (*)    All other components within normal limits  CBC WITH DIFFERENTIAL/PLATELET   ____________________________________________  EKG  ____________________________________________  RADIOLOGY  ED MD interpretation: CT head without contrast shows no evidence of hemorrhage, skull fracture, mass-effect, edema, ventriculomegaly or other clear acute process.  Official radiology report(s): CT Head Wo Contrast  Result Date: 03/01/2021 CLINICAL DATA:  50 year old male with history of new  or worsening headache in the occipital region since yesterday. EXAM: CT HEAD WITHOUT CONTRAST TECHNIQUE: Contiguous axial images were obtained from the base of the skull through the vertex without intravenous contrast. COMPARISON:  Head CT 08/26/2018. FINDINGS: Brain: No evidence of acute infarction, hemorrhage, hydrocephalus, extra-axial collection or mass lesion/mass effect. Vascular: No hyperdense vessel or unexpected calcification. Skull: Normal. Negative for fracture or focal lesion. Sinuses/Orbits: No acute finding. Other: None. IMPRESSION: 1. No acute intracranial abnormalities. The appearance of the brain is normal. Electronically Signed   By: Trudie Reed  M.D.   On: 03/01/2021 05:37    ____________________________________________   PROCEDURES  Procedure(s) performed (including Critical Care):  Procedures   ____________________________________________   INITIAL IMPRESSION / ASSESSMENT AND PLAN / ED COURSE      Patient presents with above-stated history exam for assessment of headache over the last couple days associated with some mild photophobia in the setting of binge drinking episode.  Patient is not sure how much she had to drink.  On arrival he is afebrile hemodynamically stable.  He does not seem meningitic and there is no evidence of deep space infection in the head or neck.  He has a nonfocal neurological exam does not suggest of CVA.  Differential considerations include subarachnoid from possible fall patient does not recall, metabolic derangements, primary headache i.e. tension or migraine, dehydration, effects of alcohol intoxication was a lower suspicion at this time for venous sinus thrombosis or other toxicological cause.  CT head without contrast shows no evidence of hemorrhage, skull fracture, mass-effect, edema, ventriculomegaly or other clear acute process.  CMP shows no significant electrolyte or metabolic derangements.  CBC shows no leukocytosis or acute  anemia.  Serum ethanol is elevated to  244.  Patient given some IV fluids and below noted migraine cocktail.  On reassessment he states he is feeling vastly much better.  Discussed at length dangers of excessive alcohol use.  Given patient states he is feeling much better with otherwise reassuring exam and head CT and low suspicion for other immediate life-threatening process at this time I think patient is stable for discharge with outpatient follow-up.  Discharged in stable condition.  Strict precautions advised and discussed.      ____________________________________________   FINAL CLINICAL IMPRESSION(S) / ED DIAGNOSES  Final diagnoses:  Nonintractable headache, unspecified chronicity pattern, unspecified headache type  Alcohol abuse    Medications  lactated ringers bolus 1,000 mL (0 mLs Intravenous Stopped 03/01/21 0923)  prochlorperazine (COMPAZINE) injection 10 mg (10 mg Intravenous Given 03/01/21 0815)  magnesium sulfate IVPB 2 g 50 mL (0 g Intravenous Stopped 03/01/21 0923)  ketorolac (TORADOL) 30 MG/ML injection 15 mg (15 mg Intravenous Given 03/01/21 3151)     ED Discharge Orders     None        Note:  This document was prepared using Dragon voice recognition software and may include unintentional dictation errors.    Gilles Chiquito, MD 03/01/21 [phone]

## 2021-03-01 NOTE — ED Notes (Signed)
Patient discharged to home per MD order. Patient in stable condition, and deemed medically cleared by ED provider for discharge. Discharge instructions reviewed with patient/family using "Teach Back"; verbalized understanding of medication education and administration, and information about follow-up care. Denies further concerns. ° °

## 2022-11-13 IMAGING — CT CT HEAD W/O CM
4 series · 16 of 47 positions shown, 18 images · non-contrast
Comparison: Head CT 08/26/2018.

CLINICAL DATA: 50-year-old male with history of new or worsening
headache in the occipital region since yesterday.

EXAM:
CT HEAD WITHOUT CONTRAST
TECHNIQUE: Contiguous axial images were obtained from the base of the skull
through the vertex without intravenous contrast.

[Series 2: head bone · axial · 0.43mm/px · z∈[-116,-84]mm · 3 of 83 slices shown]
[im 9/83  bone]
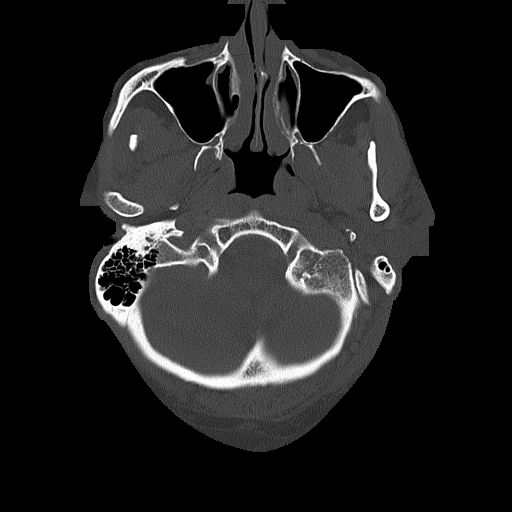
[im 17/83  bone]
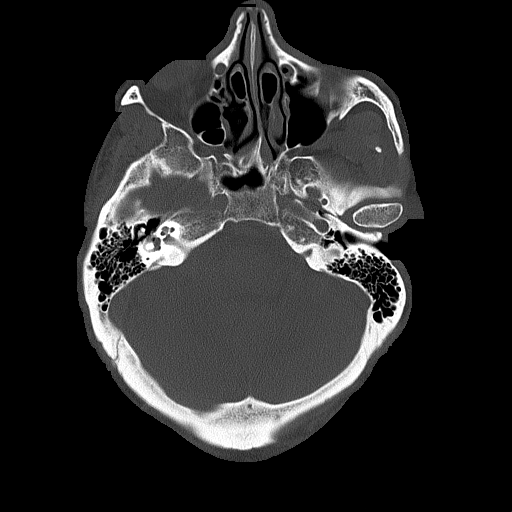
[im 25/83  bone]
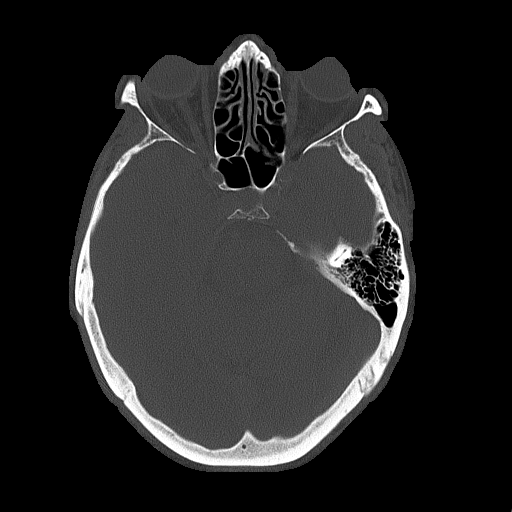

[Series 3: head wo · axial · 0.43mm/px · z∈[-112,+8]mm · 7 of 34 slices shown, 9 images]
[im 5/34  brain]
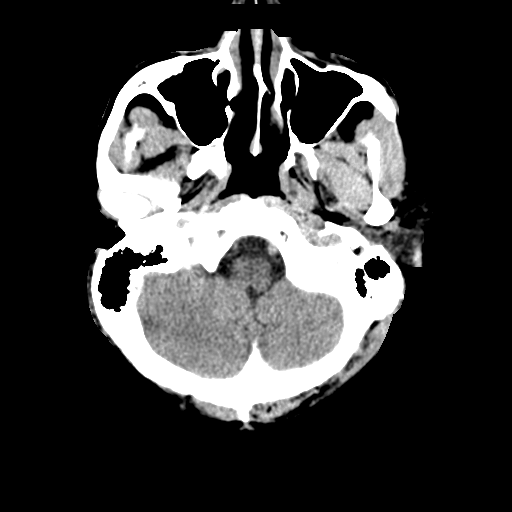
[im 5/34  bone]
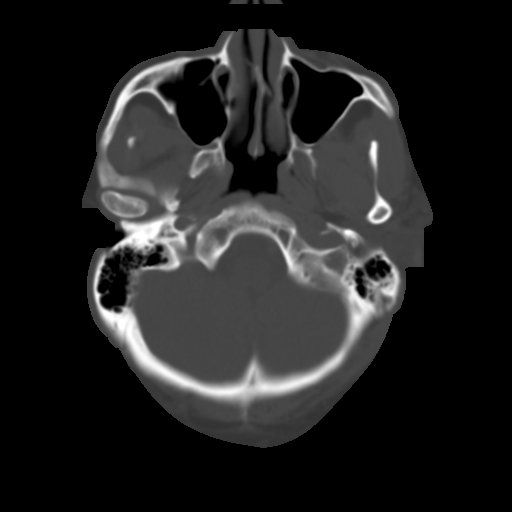
[im 9/34  brain]
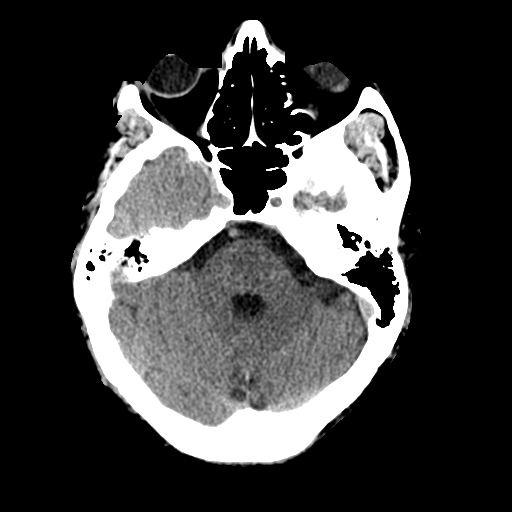
[im 13/34  brain]
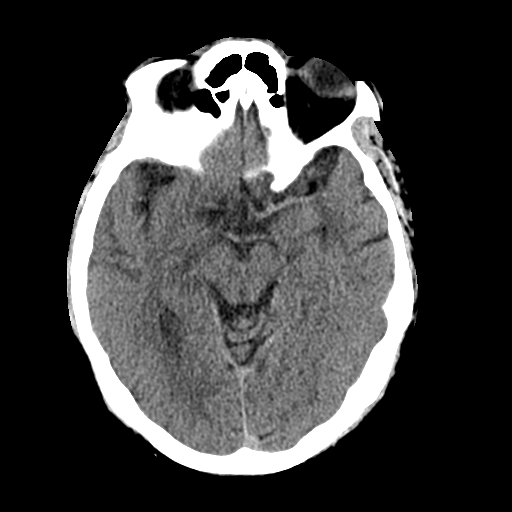
[im 17/34  brain]
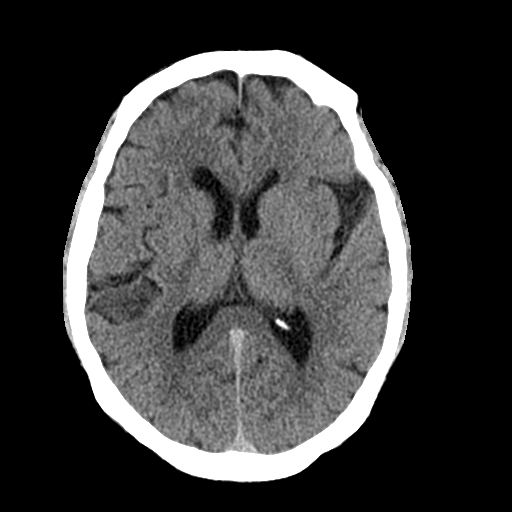
[im 21/34  brain]
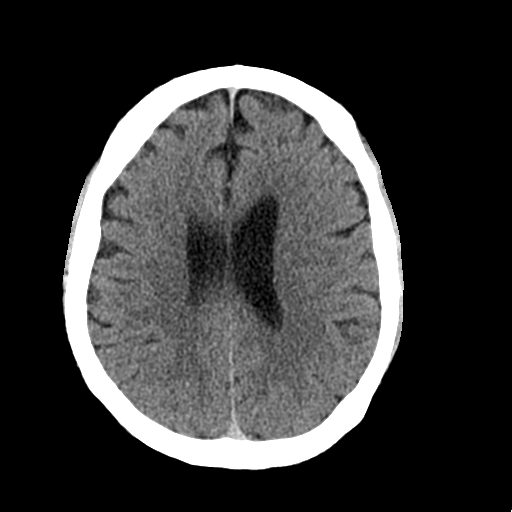
[im 21/34  bone]
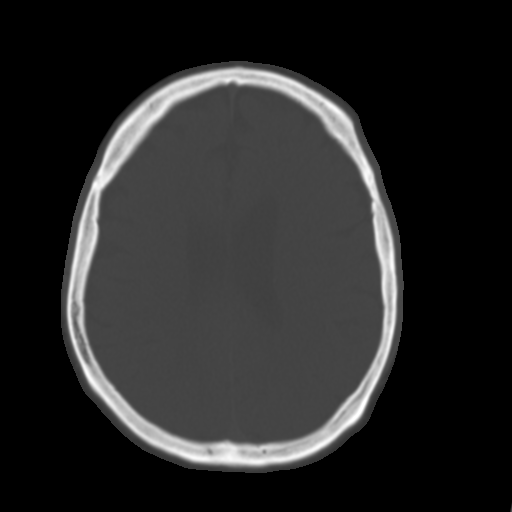
[im 25/34  brain]
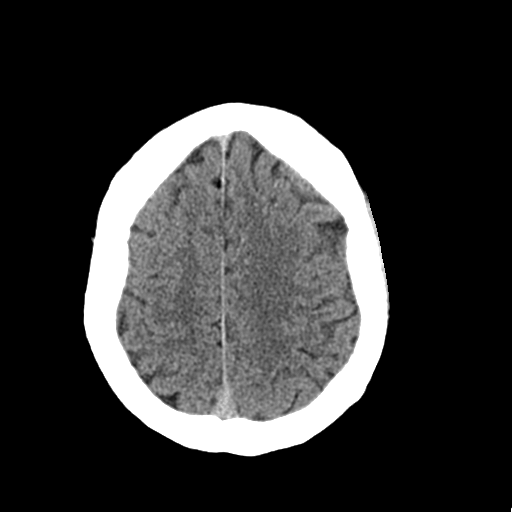
[im 29/34  brain]
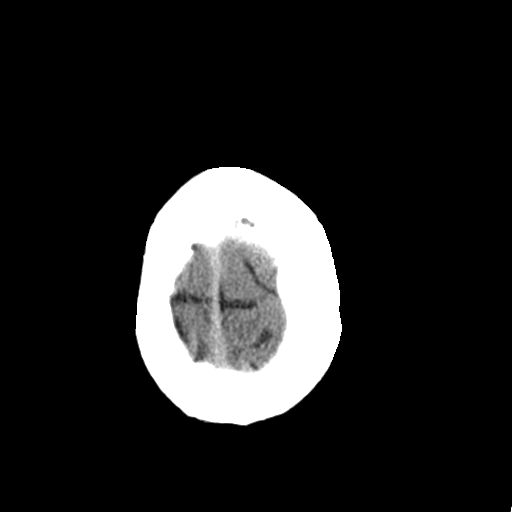

[Series 4: coronal soft tissue · coronal · 0.31mm/px · 3 of 71 slices shown]
[im 24/71  brain]
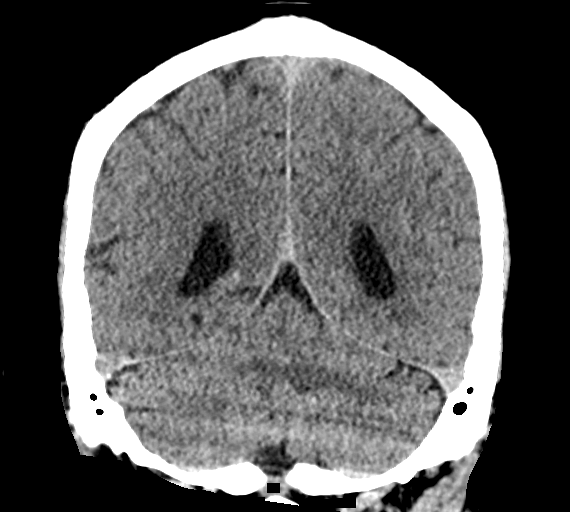
[im 32/71  brain]
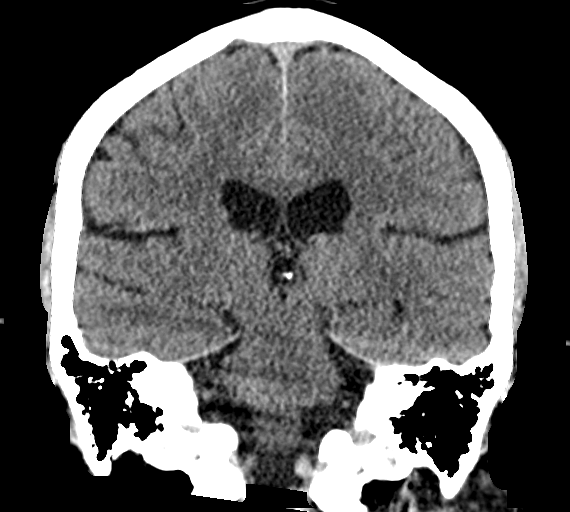
[im 39/71  brain]
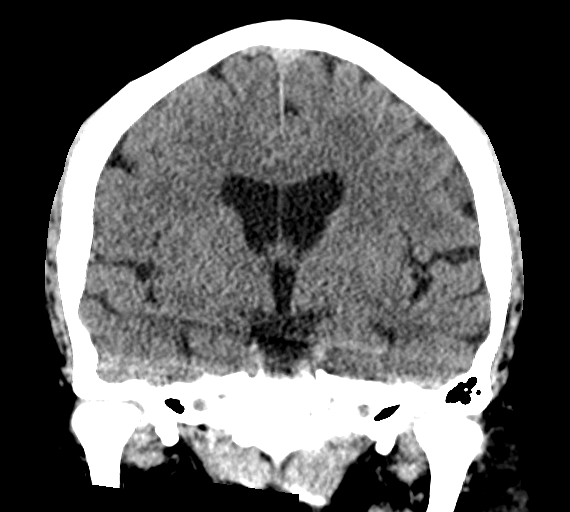

[Series 5: sagittal soft tissue · sagittal · 0.31mm/px · 3 of 60 slices shown]
[im 23/60  brain]
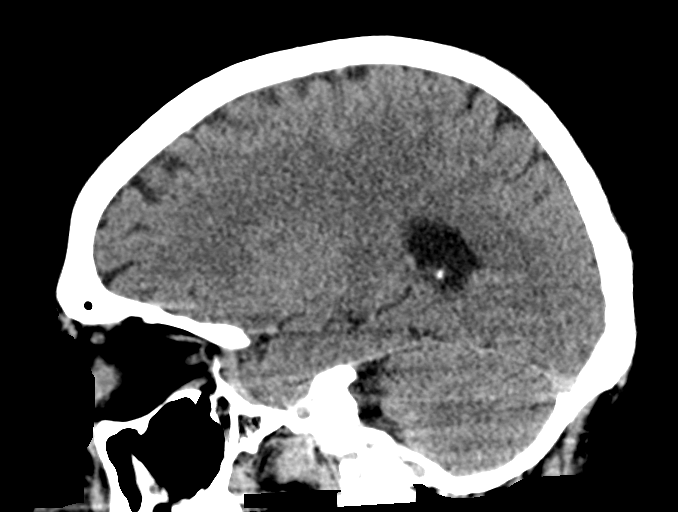
[im 30/60  brain]
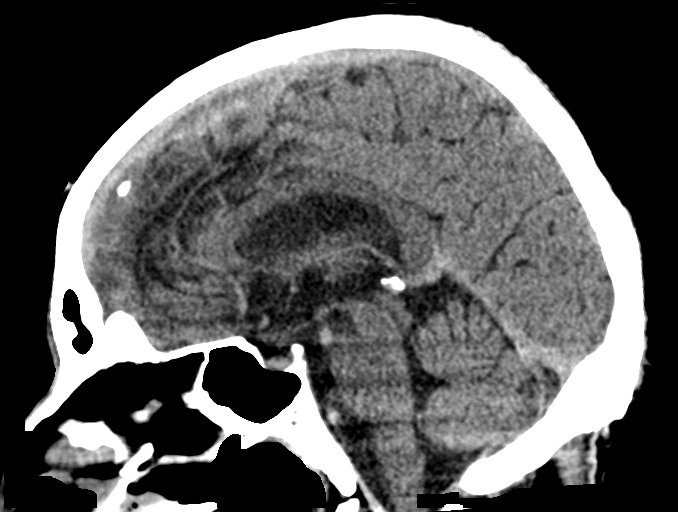
[im 38/60  brain]
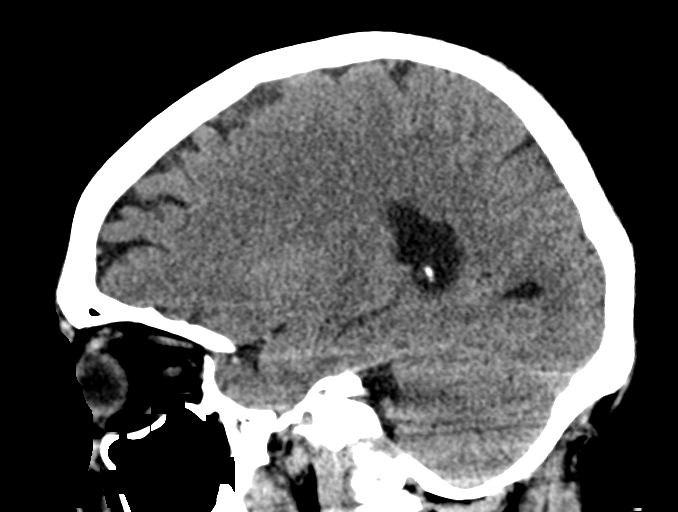

[16 of 47 positions shown; findings below may reference images not displayed]

FINDINGS: Brain: No evidence of acute infarction, hemorrhage, hydrocephalus,
extra-axial collection or mass lesion/mass effect.

Vascular: No hyperdense vessel or unexpected calcification.

Skull: Normal. Negative for fracture or focal lesion.

Sinuses/Orbits: No acute finding.

Other: None.
IMPRESSION: 1. No acute intracranial abnormalities. The appearance of the brain
is normal.
